# Patient Record
Sex: Female | Born: 1975 | Race: White | Hispanic: No | Smoking: Former smoker
Health system: Southern US, Community
[De-identification: ages and names within clinical notes are randomized; demographics above are authoritative.]

## PROBLEM LIST (undated history)

## (undated) ENCOUNTER — Emergency Department (HOSPITAL_COMMUNITY): Admission: EM | Discharge status: Against Medical Advice | Payer: Self-pay

## (undated) DIAGNOSIS — A599 Trichomoniasis, unspecified: Secondary | ICD-10-CM

## (undated) DIAGNOSIS — A630 Anogenital (venereal) warts: Secondary | ICD-10-CM

## (undated) DIAGNOSIS — J40 Bronchitis, not specified as acute or chronic: Secondary | ICD-10-CM

## (undated) HISTORY — DX: Trichomoniasis, unspecified: A59.9

## (undated) HISTORY — PX: FEMUR FRACTURE SURGERY: SHX633

---

## 2003-10-25 ENCOUNTER — Ambulatory Visit (HOSPITAL_COMMUNITY): Admission: RE | Admit: 2003-10-25 | Discharge: 2003-10-25 | Payer: Self-pay | Admitting: Obstetrics & Gynecology

## 2004-02-12 ENCOUNTER — Ambulatory Visit: Payer: Self-pay | Admitting: Family Medicine

## 2004-03-05 ENCOUNTER — Ambulatory Visit: Payer: Self-pay | Admitting: Family Medicine

## 2004-05-06 ENCOUNTER — Ambulatory Visit: Payer: Self-pay | Admitting: Family Medicine

## 2004-05-06 ENCOUNTER — Ambulatory Visit (HOSPITAL_COMMUNITY): Admission: RE | Admit: 2004-05-06 | Discharge: 2004-05-06 | Payer: Self-pay | Admitting: Family Medicine

## 2007-11-26 ENCOUNTER — Emergency Department (HOSPITAL_COMMUNITY): Admission: EM | Admit: 2007-11-26 | Discharge: 2007-11-27 | Payer: Self-pay | Admitting: Emergency Medicine

## 2008-01-25 ENCOUNTER — Emergency Department (HOSPITAL_COMMUNITY): Admission: EM | Admit: 2008-01-25 | Discharge: 2008-01-25 | Payer: Self-pay | Admitting: Emergency Medicine

## 2008-05-08 ENCOUNTER — Emergency Department (HOSPITAL_COMMUNITY): Admission: EM | Admit: 2008-05-08 | Discharge: 2008-05-08 | Payer: Self-pay | Admitting: Emergency Medicine

## 2008-05-17 ENCOUNTER — Emergency Department (HOSPITAL_COMMUNITY): Admission: EM | Admit: 2008-05-17 | Discharge: 2008-05-17 | Payer: Self-pay | Admitting: Family Medicine

## 2008-06-12 ENCOUNTER — Emergency Department (HOSPITAL_COMMUNITY): Admission: EM | Admit: 2008-06-12 | Discharge: 2008-06-12 | Payer: Self-pay | Admitting: Emergency Medicine

## 2008-11-05 ENCOUNTER — Inpatient Hospital Stay (HOSPITAL_COMMUNITY): Admission: AD | Admit: 2008-11-05 | Discharge: 2008-11-07 | Payer: Self-pay | Admitting: Obstetrics & Gynecology

## 2009-08-03 ENCOUNTER — Ambulatory Visit (HOSPITAL_COMMUNITY): Admission: RE | Admit: 2009-08-03 | Discharge: 2009-08-03 | Payer: Self-pay | Admitting: Obstetrics & Gynecology

## 2009-10-23 ENCOUNTER — Ambulatory Visit (HOSPITAL_COMMUNITY): Admission: RE | Admit: 2009-10-23 | Discharge: 2009-10-23 | Payer: Self-pay | Admitting: Obstetrics & Gynecology

## 2009-12-20 ENCOUNTER — Encounter: Payer: Self-pay | Admitting: *Deleted

## 2009-12-20 ENCOUNTER — Emergency Department (HOSPITAL_COMMUNITY): Admission: EM | Admit: 2009-12-20 | Discharge: 2009-12-20 | Payer: Self-pay | Admitting: Family Medicine

## 2010-01-08 ENCOUNTER — Ambulatory Visit (HOSPITAL_COMMUNITY): Admission: RE | Admit: 2010-01-08 | Discharge: 2010-01-08 | Payer: Self-pay | Admitting: Obstetrics

## 2010-02-14 ENCOUNTER — Emergency Department (HOSPITAL_COMMUNITY): Admission: EM | Admit: 2010-02-14 | Discharge: 2010-02-14 | Payer: Self-pay | Admitting: Emergency Medicine

## 2010-02-28 ENCOUNTER — Inpatient Hospital Stay (HOSPITAL_COMMUNITY): Admission: AD | Admit: 2010-02-28 | Discharge: 2010-03-02 | Payer: Self-pay | Admitting: Obstetrics

## 2010-05-08 NOTE — Miscellaneous (Signed)
Summary: gave 1 time only for UC to see her.   Clinical Lists Changes our name is on her M card. told them ok to see her but instruct pt to get our name off the card.Golden Circle RN  December 20, 2009 9:15 AM

## 2010-06-07 ENCOUNTER — Emergency Department (HOSPITAL_COMMUNITY)
Admission: EM | Admit: 2010-06-07 | Discharge: 2010-06-07 | Disposition: A | Discharge status: Home / Self Care | Payer: Medicaid Other | Attending: Emergency Medicine | Admitting: Emergency Medicine

## 2010-06-07 DIAGNOSIS — K625 Hemorrhage of anus and rectum: Secondary | ICD-10-CM | POA: Insufficient documentation

## 2010-06-07 DIAGNOSIS — A630 Anogenital (venereal) warts: Secondary | ICD-10-CM | POA: Insufficient documentation

## 2010-06-07 LAB — DIFFERENTIAL
Eosinophils Absolute: 0.3 10*3/uL (ref 0.0–0.7)
Lymphs Abs: 3 10*3/uL (ref 0.7–4.0)
Monocytes Absolute: 0.8 10*3/uL (ref 0.1–1.0)
Monocytes Relative: 7 % (ref 3–12)
Neutrophils Relative %: 63 % (ref 43–77)

## 2010-06-07 LAB — CBC
MCH: 29.1 pg (ref 26.0–34.0)
MCHC: 33.3 g/dL (ref 30.0–36.0)
MCV: 87.4 fL (ref 78.0–100.0)
RBC: 4.78 MIL/uL (ref 3.87–5.11)
RDW: 14.2 % (ref 11.5–15.5)

## 2010-06-18 LAB — CBC
HCT: 33.9 % — ABNORMAL LOW (ref 36.0–46.0)
Hemoglobin: 11.3 g/dL — ABNORMAL LOW (ref 12.0–15.0)
Hemoglobin: 13 g/dL (ref 12.0–15.0)
MCH: 30.7 pg (ref 26.0–34.0)
MCH: 30.7 pg (ref 26.0–34.0)
MCHC: 33.3 g/dL (ref 30.0–36.0)
MCV: 92.1 fL (ref 78.0–100.0)
Platelets: 184 10*3/uL (ref 150–400)
RBC: 4.25 MIL/uL (ref 3.87–5.11)
RDW: 14.4 % (ref 11.5–15.5)
WBC: 13 10*3/uL — ABNORMAL HIGH (ref 4.0–10.5)

## 2010-06-18 LAB — RH IMMUNE GLOB WKUP(>/=20WKS)(NOT WOMEN'S HOSP): Fetal Screen: NEGATIVE

## 2010-06-18 LAB — RPR: RPR Ser Ql: NONREACTIVE

## 2010-06-20 LAB — RH IMMUNE GLOBULIN WORKUP (NOT WOMEN'S HOSP)

## 2010-07-13 LAB — RH IMMUNE GLOB WKUP(>/=20WKS)(NOT WOMEN'S HOSP)

## 2010-07-13 LAB — CBC
HCT: 31.4 % — ABNORMAL LOW (ref 36.0–46.0)
HCT: 38.8 % (ref 36.0–46.0)
Hemoglobin: 11 g/dL — ABNORMAL LOW (ref 12.0–15.0)
Hemoglobin: 13.1 g/dL (ref 12.0–15.0)
MCHC: 35.1 g/dL (ref 30.0–36.0)
MCV: 90.7 fL (ref 78.0–100.0)
RBC: 3.46 MIL/uL — ABNORMAL LOW (ref 3.87–5.11)
RBC: 4.21 MIL/uL (ref 3.87–5.11)
RDW: 14.4 % (ref 11.5–15.5)
WBC: 13.9 10*3/uL — ABNORMAL HIGH (ref 4.0–10.5)
WBC: 15.5 10*3/uL — ABNORMAL HIGH (ref 4.0–10.5)

## 2010-07-23 LAB — POCT URINALYSIS DIP (DEVICE)
Glucose, UA: NEGATIVE mg/dL
Hgb urine dipstick: NEGATIVE
Nitrite: NEGATIVE
Urobilinogen, UA: 0.2 mg/dL (ref 0.0–1.0)
pH: 5.5 (ref 5.0–8.0)

## 2011-01-06 ENCOUNTER — Emergency Department (HOSPITAL_COMMUNITY): Payer: Medicaid Other

## 2011-01-06 ENCOUNTER — Emergency Department (HOSPITAL_COMMUNITY)
Admission: EM | Admit: 2011-01-06 | Discharge: 2011-01-06 | Disposition: A | Discharge status: Home / Self Care | Payer: Medicaid Other | Attending: Emergency Medicine | Admitting: Emergency Medicine

## 2011-01-06 DIAGNOSIS — R630 Anorexia: Secondary | ICD-10-CM | POA: Insufficient documentation

## 2011-01-06 DIAGNOSIS — D72829 Elevated white blood cell count, unspecified: Secondary | ICD-10-CM | POA: Insufficient documentation

## 2011-01-06 DIAGNOSIS — R10816 Epigastric abdominal tenderness: Secondary | ICD-10-CM | POA: Insufficient documentation

## 2011-01-06 DIAGNOSIS — M549 Dorsalgia, unspecified: Secondary | ICD-10-CM | POA: Insufficient documentation

## 2011-01-06 DIAGNOSIS — R1013 Epigastric pain: Secondary | ICD-10-CM | POA: Insufficient documentation

## 2011-01-06 DIAGNOSIS — E669 Obesity, unspecified: Secondary | ICD-10-CM | POA: Insufficient documentation

## 2011-01-06 LAB — COMPREHENSIVE METABOLIC PANEL
ALT: 14 U/L (ref 0–35)
Alkaline Phosphatase: 93 U/L (ref 39–117)
Chloride: 102 mEq/L (ref 96–112)
Creatinine, Ser: 0.85 mg/dL (ref 0.50–1.10)
GFR calc Af Amer: >90 mL/min (ref >90)
GFR calc non Af Amer: 88 mL/min — ABNORMAL LOW (ref >90)
Total Protein: 7.1 g/dL (ref 6.0–8.3)

## 2011-01-06 LAB — DIFFERENTIAL
Eosinophils Absolute: 0.3 10*3/uL (ref 0.0–0.7)
Eosinophils Relative: 3 % (ref 0–5)
Lymphs Abs: 2.9 10*3/uL (ref 0.7–4.0)
Monocytes Relative: 8 % (ref 3–12)

## 2011-01-06 LAB — LIPASE, BLOOD: Lipase: 33 U/L (ref 11–59)

## 2011-01-06 LAB — CBC
MCH: 30.1 pg (ref 26.0–34.0)
MCHC: 34.1 g/dL (ref 30.0–36.0)
MCV: 88.4 fL (ref 78.0–100.0)
Platelets: 248 10*3/uL (ref 150–400)
RDW: 14.3 % (ref 11.5–15.5)

## 2011-01-06 LAB — URINALYSIS, ROUTINE W REFLEX MICROSCOPIC
Glucose, UA: NEGATIVE mg/dL
Hgb urine dipstick: NEGATIVE
Ketones, ur: NEGATIVE mg/dL
Protein, ur: NEGATIVE mg/dL
Urobilinogen, UA: 0.2 mg/dL (ref 0.0–1.0)

## 2011-03-09 ENCOUNTER — Encounter: Payer: Self-pay | Admitting: *Deleted

## 2011-03-09 ENCOUNTER — Emergency Department (HOSPITAL_COMMUNITY)
Admission: EM | Admit: 2011-03-09 | Discharge: 2011-03-09 | Disposition: A | Discharge status: Home / Self Care | Payer: Self-pay | Attending: Emergency Medicine | Admitting: Emergency Medicine

## 2011-03-09 ENCOUNTER — Emergency Department (HOSPITAL_COMMUNITY): Payer: Self-pay

## 2011-03-09 DIAGNOSIS — R059 Cough, unspecified: Secondary | ICD-10-CM | POA: Insufficient documentation

## 2011-03-09 DIAGNOSIS — R0789 Other chest pain: Secondary | ICD-10-CM | POA: Insufficient documentation

## 2011-03-09 DIAGNOSIS — R05 Cough: Secondary | ICD-10-CM | POA: Insufficient documentation

## 2011-03-09 DIAGNOSIS — R6883 Chills (without fever): Secondary | ICD-10-CM | POA: Insufficient documentation

## 2011-03-09 DIAGNOSIS — IMO0001 Reserved for inherently not codable concepts without codable children: Secondary | ICD-10-CM | POA: Insufficient documentation

## 2011-03-09 DIAGNOSIS — R070 Pain in throat: Secondary | ICD-10-CM | POA: Insufficient documentation

## 2011-03-09 DIAGNOSIS — J069 Acute upper respiratory infection, unspecified: Secondary | ICD-10-CM | POA: Insufficient documentation

## 2011-03-09 HISTORY — DX: Bronchitis, not specified as acute or chronic: J40

## 2011-03-09 MED ORDER — IBUPROFEN 800 MG PO TABS: 800.0000 mg | ORAL_TABLET | Freq: Three times a day (TID) | ORAL | Status: AC

## 2011-03-09 MED ORDER — ACETAMINOPHEN-CODEINE 120-12 MG/5ML PO SUSP: 5.0000 mL | Freq: Four times a day (QID) | ORAL | Status: AC | PRN

## 2011-03-09 MED ORDER — SALINE NASAL SPRAY 0.65 % NA SOLN: 1.0000 | NASAL | Status: DC | PRN

## 2011-03-09 NOTE — ED Notes (Signed)
Pt has been coughing since Wed.  Denies hx of asthma.  Denies fevers.  Lung sounds clear.  Pt w/non-productive, insistent cough.  Denies pain, but states she has been vomiting d/t the force of coughing.

## 2011-03-09 NOTE — ED Notes (Signed)
Patient with c/o bronichitis

## 2011-03-09 NOTE — ED Provider Notes (Signed)
History     CSN: [id number] Arrival date & time: 03/09/2011 12:23 AM   First MD Initiated Contact with Patient 03/09/11 [phone]      Chief Complaint  Patient presents with  . Bronchitis    (Consider location/radiation/quality/duration/timing/severity/associated sxs/prior treatment) Patient is a 35 y.o. female presenting with cough. The history is provided by the patient.  Cough This is a new problem. The current episode started 2 days ago. The problem occurs constantly. The problem has not changed since onset.The cough is non-productive. There has been no fever. Associated symptoms include chills, sore throat and myalgias. Pertinent negatives include no headaches and no shortness of breath. She has tried decongestants for the symptoms. The treatment provided no relief. Risk factors: Has a sick contacts at home both of her children are sick as well. She is a smoker. Her past medical history is significant for bronchitis.   moderate in severity. Some mild chest discomfort with coughing no radiation of pain. No known aggravating factors and symptoms not improved with over-the-counter medications at home. She continues to smoke. She has not received a flu shot this year.  Past Medical History  Diagnosis Date  . Bronchitis     History reviewed. No pertinent past surgical history.  History reviewed. No pertinent family history.  History  Substance Use Topics  . Smoking status: Current Some Day Smoker  . Smokeless tobacco: Not on file  . Alcohol Use: No    OB History    Grav Para Term Preterm Abortions TAB SAB Ect Mult Living                  Review of Systems  Constitutional: Positive for chills. Negative for fever.  HENT: Positive for sore throat. Negative for trouble swallowing, neck pain, neck stiffness and voice change.   Eyes: Negative for pain.  Respiratory: Positive for cough. Negative for shortness of breath.   Cardiovascular: Negative for palpitations and leg swelling.    Gastrointestinal: Negative for abdominal pain.  Genitourinary: Negative for dysuria.  Musculoskeletal: Positive for myalgias. Negative for back pain.  Skin: Negative for rash.  Neurological: Negative for headaches.  All other systems reviewed and are negative.    Allergies  Review of patient's allergies indicates no known allergies.  Home Medications   Current Outpatient Rx  Name Route Sig Dispense Refill  . ACETAMINOPHEN 500 MG PO TABS Oral Take 1,000 mg by mouth every 6 (six) hours as needed. For pain       BP 127/84  Pulse 77  Temp(Src) 98.5 F (36.9 C) (Oral)  Resp 12  SpO2 99%  Physical Exam  Constitutional: She is oriented to person, place, and time. She appears well-developed and well-nourished.  HENT:  Head: Normocephalic and atraumatic.  Mouth/Throat: No oropharyngeal exudate.  Eyes: Conjunctivae and EOM are normal. Pupils are equal, round, and reactive to light.  Neck: Trachea normal. Neck supple. No thyromegaly present.  Cardiovascular: Normal rate, regular rhythm, S1 normal, S2 normal and normal pulses.     No systolic murmur is present   No diastolic murmur is present  Pulses:      Radial pulses are 2+ on the right side, and 2+ on the left side.  Pulmonary/Chest: Effort normal and breath sounds normal. She has no wheezes. She has no rhonchi. She has no rales. She exhibits no tenderness.  Abdominal: Soft. Normal appearance and bowel sounds are normal. There is no tenderness. There is no CVA tenderness and negative Murphy's sign.  Musculoskeletal:  BLE:s Calves nontender, no cords or erythema, negative Homans sign  Neurological: She is alert and oriented to person, place, and time. She has normal strength. No cranial nerve deficit or sensory deficit. GCS eye subscore is 4. GCS verbal subscore is 5. GCS motor subscore is 6.  Skin: Skin is warm and dry. No rash noted. She is not diaphoretic.  Psychiatric: Her speech is normal.       Cooperative and  appropriate    ED Course  Procedures (including critical care time)  Labs Reviewed - No data to display Dg Chest 2 View  03/09/2011  *RADIOLOGY REPORT*  Clinical Data: Cough and chest pain; burning throat.  CHEST - 2 VIEW  Comparison: None.  Findings: The lungs are well-aerated.  Peribronchial thickening is noted.  Mildly increased interstitial markings may be chronic in nature.  There is no evidence of focal opacification, pleural effusion or pneumothorax.  The heart is normal in size; the mediastinal contour is within normal limits.  No acute osseous abnormalities are seen.  IMPRESSION: Peribronchial thickening noted; mildly increased interstitial markings may be chronic in nature.  No evidence of focal consolidation.  Original Report Authenticated By: Tonia Ghent, M.D.   Pulse ox 96% room air is adequate  Diagnosis URI   MDM   Clinical URI, likely viral. Chest x-ray obtained and reviewed as above. Plan prescriptions and outpatient treatment for the same. No indication for further workup or admission at this time. Patient understands smoking is bad for her health and agrees to get a flu shot when she is feeling better        Sunnie Nielsen, MD 03/09/11 [phone]

## 2011-04-21 ENCOUNTER — Emergency Department (HOSPITAL_COMMUNITY)
Admission: EM | Admit: 2011-04-21 | Discharge: 2011-04-21 | Disposition: A | Discharge status: Home / Self Care | Payer: Self-pay | Attending: Emergency Medicine | Admitting: Emergency Medicine

## 2011-04-21 ENCOUNTER — Encounter (HOSPITAL_COMMUNITY): Payer: Self-pay | Admitting: Emergency Medicine

## 2011-04-21 DIAGNOSIS — S92919A Unspecified fracture of unspecified toe(s), initial encounter for closed fracture: Secondary | ICD-10-CM | POA: Insufficient documentation

## 2011-04-21 DIAGNOSIS — M7989 Other specified soft tissue disorders: Secondary | ICD-10-CM | POA: Insufficient documentation

## 2011-04-21 DIAGNOSIS — IMO0002 Reserved for concepts with insufficient information to code with codable children: Secondary | ICD-10-CM | POA: Insufficient documentation

## 2011-04-21 DIAGNOSIS — M79609 Pain in unspecified limb: Secondary | ICD-10-CM | POA: Insufficient documentation

## 2011-04-21 DIAGNOSIS — S92911A Unspecified fracture of right toe(s), initial encounter for closed fracture: Secondary | ICD-10-CM

## 2011-04-21 MED ORDER — NAPROXEN 500 MG PO TABS: 500.0000 mg | ORAL_TABLET | Freq: Two times a day (BID) | ORAL | Status: AC

## 2011-04-21 NOTE — ED Notes (Signed)
Pt hit R pinky toe very hard on 04/19/11.  Pt has trouble walking.  Foot very bruised

## 2011-04-21 NOTE — Progress Notes (Signed)
Orthopedic Tech Progress Note Patient Details:  Alicia Greene 02/04/1976 [id number]  Other Ortho Devices Type of Ortho Device: Buddy tape;Postop boot Ortho Device Location: applied buddy tape and post op shoe to right foot   Gaye Pollack 04/21/2011, 7:38 AM

## 2011-04-21 NOTE — ED Notes (Signed)
Done per ortho

## 2011-04-21 NOTE — ED Provider Notes (Signed)
History     CSN: [id number]  Arrival date & time 04/21/11  4098   First MD Initiated Contact with Patient 04/21/11 0703      Chief Complaint  Patient presents with  . Toe Injury    (Consider location/radiation/quality/duration/timing/severity/associated sxs/prior treatment) Patient is a 36 y.o. female presenting with toe pain. The history is provided by the patient.  Toe Pain This is a new problem. The current episode started 2 days ago. The problem occurs constantly. The problem has not changed since onset.Associated symptoms comments: None . The symptoms are aggravated by walking. The symptoms are relieved by nothing. She has tried nothing for the symptoms.  Pt stubbed her toe hard on Saturday night.  She has noticed bruising and swelling.  Pt tried to go to work this am and it hurt too much to put her boot on.  Past Medical History  Diagnosis Date  . Bronchitis     History reviewed. No pertinent past surgical history.  No family history on file.  History  Substance Use Topics  . Smoking status: Current Some Day Smoker  . Smokeless tobacco: Not on file  . Alcohol Use: No    OB History    Grav Para Term Preterm Abortions TAB SAB Ect Mult Living                  Review of Systems  All other systems reviewed and are negative.    Allergies  Review of patient's allergies indicates no known allergies.  Home Medications   Current Outpatient Rx  Name Route Sig Dispense Refill  . IBUPROFEN 800 MG PO TABS Oral Take 800 mg by mouth as needed. For menstrual cramps    . NAPROXEN 500 MG PO TABS Oral Take 1 tablet (500 mg total) by mouth 2 (two) times daily. 30 tablet 0    There were no vitals taken for this visit.  Physical Exam  Nursing note and vitals reviewed. Constitutional: She appears well-developed and well-nourished. No distress.  HENT:  Head: Normocephalic and atraumatic.  Right Ear: External ear normal.  Left Ear: External ear normal.  Eyes:  Conjunctivae are normal. Right eye exhibits no discharge. Left eye exhibits no discharge. No scleral icterus.  Neck: Neck supple. No tracheal deviation present.  Cardiovascular: Normal rate.   Pulmonary/Chest: Effort normal. No stridor. No respiratory distress.  Musculoskeletal: She exhibits no edema.       Right foot: She exhibits bony tenderness and swelling. She exhibits no deformity and no laceration.       Feet:  Neurological: She is alert. Cranial nerve deficit: no gross deficits.  Skin: Skin is warm and dry. No rash noted.  Psychiatric: She has a normal mood and affect.    ED Course  Procedures (including critical care time)  Labs Reviewed - No data to display No results found.   1. Toe fracture, right       MDM  I suspect patient has a distal phalanx fracture of her toe. Clinically she has bruising and swelling. Discussed with the patient about the x-ray and that it would not change management whether she had bruising alone or fracture. She opted for no x-ray and presumptive treatment.        Celene Kras, MD 04/21/11 [phone]

## 2011-05-26 ENCOUNTER — Encounter (HOSPITAL_COMMUNITY): Payer: Self-pay | Admitting: Emergency Medicine

## 2011-05-26 ENCOUNTER — Emergency Department (INDEPENDENT_AMBULATORY_CARE_PROVIDER_SITE_OTHER)
Admission: EM | Admit: 2011-05-26 | Discharge: 2011-05-26 | Disposition: A | Discharge status: Home Health | Payer: Self-pay | Source: Home / Self Care | Attending: Emergency Medicine | Admitting: Emergency Medicine

## 2011-05-26 DIAGNOSIS — L255 Unspecified contact dermatitis due to plants, except food: Secondary | ICD-10-CM

## 2011-05-26 MED ORDER — PREDNISONE 10 MG PO TABS: ORAL_TABLET | ORAL | Status: DC

## 2011-05-26 MED ORDER — TRIAMCINOLONE ACETONIDE 0.1 % EX CREA: TOPICAL_CREAM | Freq: Three times a day (TID) | CUTANEOUS | Status: DC

## 2011-05-26 MED ORDER — METHYLPREDNISOLONE ACETATE PF 80 MG/ML IJ SUSP
80.0000 mg | Freq: Once | INTRAMUSCULAR | Status: AC
  Administered 2011-05-26: 80 mg via INTRAMUSCULAR

## 2011-05-26 MED ORDER — METHYLPREDNISOLONE ACETATE 80 MG/ML IJ SUSP
INTRAMUSCULAR | Status: AC
  Filled 2011-05-26: qty 1

## 2011-05-26 MED ORDER — HYDROXYZINE HCL 25 MG PO TABS: 25.0000 mg | ORAL_TABLET | Freq: Four times a day (QID) | ORAL | Status: DC

## 2011-05-26 NOTE — ED Provider Notes (Signed)
Chief Complaint  Patient presents with  . Rash  . Poison Ivy    History of Present Illness:   [place] work out in her yard 4 days ago and next day he broke out in a pruritic rash on her arms, trunk, face, and neck. She denies any difficulty breathing, wheezing, shortness of breath, or swelling of the lips, tongue, or throat.  Review of Systems:  Other than noted above, the patient denies any of the following symptoms: Systemic:  No fever, chills, sweats, weight loss, or fatigue. ENT:  No nasal congestion, rhinorrhea, sore throat, swelling of lips, tongue or throat. Resp:  No cough, wheezing, or shortness of breath. Skin:  No rash, itching, nodules, or suspicious lesions.  PMFSH:  Past medical history, family history, social history, meds, and allergies were reviewed.  Physical Exam:   Vital signs:  BP 147/88  Pulse 88  Temp(Src) 99.2 F (37.3 C) (Oral)  Resp 16  SpO2 98%  LMP 05/12/2011 Gen:  Alert, oriented, in no distress. Skin:  Exam of her skin reveals streaks and patches of erythematous maculopapules and vesicles on her arms, trunk, face, and neck. Lungs: Auscultation without wheezes, rales, or rhonchi.  Assessment:   Diagnoses that have been ruled out:  None  Diagnoses that are still under consideration:  None  Final diagnoses:  Rhus dermatitis    Plan:   1.  The following meds were prescribed:   New Prescriptions   HYDROXYZINE (ATARAX/VISTARIL) 25 MG TABLET    Take 1 tablet (25 mg total) by mouth every 6 (six) hours.   PREDNISONE (DELTASONE) 10 MG TABLET    Take 4 tabs daily for 4 days, 3 tabs daily for 4 days, 2 tabs daily for 4 days, then 1 tab daily for 4 days.   TRIAMCINOLONE CREAM (KENALOG) 0.1 %    Apply topically 3 (three) times daily.   2.  The patient was instructed in symptomatic care and handouts were given. 3.  The patient was told to return if becoming worse in any way, if no better in 3 or 4 days, and given some red flag symptoms that would indicate  earlier return.     Roque Lias, MD 05/26/11 [phone]

## 2011-05-26 NOTE — ED Notes (Signed)
Pt here with sudden outbreak of hives,poison oak that started last Friday on hands then spread all over.pus filled blisters seen to right upper arm with cellulitis.pt tried Epson bath/alcohol but it worsening.sx started after yard work last week

## 2011-05-26 NOTE — Discharge Instructions (Signed)
Poison Ivy Poison ivy is a inflammation of the skin (contact dermatitis) caused by touching the allergens on the leaves of the ivy plant following previous exposure to the plant. The rash usually appears 48 hours after exposure. The rash is usually bumps (papules) or blisters (vesicles) in a linear pattern. Depending on your own sensitivity, the rash may simply cause redness and itching, or it may also progress to blisters which may break open. These must be well cared for to prevent secondary bacterial (germ) infection, followed by scarring. Keep any open areas dry, clean, dressed, and covered with an antibacterial ointment if needed. The eyes may also get puffy. The puffiness is worst in the morning and gets better as the day progresses. This dermatitis usually heals without scarring, within 2 to 3 weeks without treatment. HOME CARE INSTRUCTIONS  Thoroughly wash with soap and water as soon as you have been exposed to poison ivy. You have about one half hour to remove the plant resin before it will cause the rash. This washing will destroy the oil or antigen on the skin that is causing, or will cause, the rash. Be sure to wash under your fingernails as any plant resin there will continue to spread the rash. Do not rub skin vigorously when washing affected area. Poison ivy cannot spread if no oil from the plant remains on your body. A rash that has progressed to weeping sores will not spread the rash unless you have not washed thoroughly. It is also important to wash any clothes you have been wearing as these may carry active allergens. The rash will return if you wear the unwashed clothing, even several days later. Avoidance of the plant in the future is the best measure. Poison ivy plant can be recognized by the number of leaves. Generally, poison ivy has three leaves with flowering branches on a single stem. Diphenhydramine may be purchased over the counter and used as needed for itching. Do not drive with  this medication if it makes you drowsy.Ask your caregiver about medication for children. SEEK MEDICAL CARE IF:  Open sores develop.   Redness spreads beyond area of rash.   You notice purulent (pus-like) discharge.   You have increased pain.   Other signs of infection develop (such as fever).  Document Released: 03/21/2000 Document Revised: 12/04/2010 Document Reviewed: 02/07/2009 [employer] Patient Information 2012 [employer]. 

## 2011-05-28 ENCOUNTER — Emergency Department (INDEPENDENT_AMBULATORY_CARE_PROVIDER_SITE_OTHER)
Admission: EM | Admit: 2011-05-28 | Discharge: 2011-05-28 | Disposition: A | Discharge status: Home Health | Payer: Self-pay | Source: Home / Self Care

## 2011-05-28 ENCOUNTER — Encounter (HOSPITAL_COMMUNITY): Payer: Self-pay

## 2011-05-28 DIAGNOSIS — L237 Allergic contact dermatitis due to plants, except food: Secondary | ICD-10-CM

## 2011-05-28 DIAGNOSIS — L255 Unspecified contact dermatitis due to plants, except food: Secondary | ICD-10-CM

## 2011-05-28 MED ORDER — PREDNISONE 10 MG PO TABS: ORAL_TABLET | ORAL | Status: DC

## 2011-05-28 MED ORDER — TRIAMCINOLONE ACETONIDE 0.1 % EX CREA: TOPICAL_CREAM | Freq: Two times a day (BID) | CUTANEOUS | Status: AC

## 2011-05-28 NOTE — ED Provider Notes (Signed)
Medical screening examination/treatment/procedure(s) were performed by non-physician practitioner and as supervising physician I was immediately available for consultation/collaboration.  Corrie Mckusick, MD 05/28/11 2214

## 2011-05-28 NOTE — ED Provider Notes (Signed)
History     CSN: [id number]  Arrival date & time 05/28/11  1253   None     Chief Complaint  Patient presents with  . Poison Ivy    (Consider location/radiation/quality/duration/timing/severity/associated sxs/prior treatment) HPI Comments: Patient presents today with complaints of worsening poison ivy symptoms. She was seen 2 days ago for the same. She received a steroid injection and has also begun taking oral prednisone. She was unable to afford the steroid cream and Atarax prescriptions. She has been instead applying alcohol and hot water to the lesions. She states that she was seen yesterday the rash continues to spread and is now on her face.   Past Medical History  Diagnosis Date  . Bronchitis     History reviewed. No pertinent past surgical history.  History reviewed. No pertinent family history.  History  Substance Use Topics  . Smoking status: Current Some Day Smoker  . Smokeless tobacco: Not on file  . Alcohol Use: No    OB History    Grav Para Term Preterm Abortions TAB SAB Ect Mult Living                  Review of Systems  All other systems reviewed and are negative.    Allergies  Review of patient's allergies indicates no known allergies.  Home Medications   Current Outpatient Rx  Name Route Sig Dispense Refill  . IBUPROFEN 800 MG PO TABS Oral Take 800 mg by mouth as needed. For menstrual cramps    . NAPROXEN 500 MG PO TABS Oral Take 1 tablet (500 mg total) by mouth 2 (two) times daily. 30 tablet 0  . PREDNISONE 10 MG PO TABS  Take as directed on discharge instructions. 10 tablet 0  . TRIAMCINOLONE ACETONIDE 0.1 % EX CREA Topical Apply topically 2 (two) times daily. 15 g 0    BP 150/87  Pulse 102  Temp(Src) 99.2 F (37.3 C) (Oral)  Resp 16  SpO2 98%  LMP 05/12/2011  Physical Exam  Nursing note and vitals reviewed. Constitutional: She appears well-developed and well-nourished. No distress.  HENT:  Head: Normocephalic and atraumatic.    Right Ear: Tympanic membrane, external ear and ear canal normal.  Left Ear: Tympanic membrane, external ear and ear canal normal.  Nose: Nose normal.  Mouth/Throat: Uvula is midline, oropharynx is clear and moist and mucous membranes are normal. No oropharyngeal exudate, posterior oropharyngeal edema or posterior oropharyngeal erythema.  Neck: Neck supple.  Cardiovascular: Normal rate, regular rhythm and normal heart sounds.   Pulmonary/Chest: Effort normal and breath sounds normal. No respiratory distress.  Lymphadenopathy:    She has no cervical adenopathy.  Neurological: She is alert.  Skin: Skin is warm and dry. Rash noted.       Erythematous papular rash noted, grouped and in linear distribution. Some lesions are scabbed and a few with vesicles seen. Mild erythema Lt upper eyelid and forehead. Lesion primarily on bilat UE, but also on thorax.   Psychiatric: She has a normal mood and affect.    ED Course  Procedures (including critical care time)  Labs Reviewed - No data to display No results found.   1. Contact dermatitis due to poison ivy       MDM  Increased oral prednisone dose to 60 mg x 2 days with new taper instructions. Discussed with pt benefit of using rx steroid cream. To d/c use of hot water and alcohol.         Karstyn Birkey  Vidal Schwalbe, PA 05/28/11 1656

## 2011-05-28 NOTE — ED Notes (Signed)
Reports worsening poison ivy symptoms, welt like eruptions on both arms, facial swelling noted

## 2011-05-28 NOTE — Discharge Instructions (Signed)
You may take Benadryl 50 mg every 6 hrs or Loratadine 10 mg once daily for itching. Follow the directions on the bottle. Stop using hot water and alcohol on your skin. You may use Calamine or Caladryl lotion to help with itching also. New prednisone dosing schedule:      6 tablets daily for 2 days, then 4 tablets daily x 3 days, then 3 tablets daily x 3 days, then 2 tablets daily x 3 days, then 1 tablet daily x 3 days. I have reprinted the steroid cream prescription for you also. You may be able to find this cheaper at another pharmacy.

## 2012-08-24 ENCOUNTER — Ambulatory Visit: Payer: Self-pay | Admitting: Obstetrics

## 2012-12-03 ENCOUNTER — Ambulatory Visit (INDEPENDENT_AMBULATORY_CARE_PROVIDER_SITE_OTHER): Payer: Medicaid Other | Admitting: Advanced Practice Midwife

## 2012-12-03 ENCOUNTER — Encounter: Payer: Self-pay | Admitting: Advanced Practice Midwife

## 2012-12-03 VITALS — BP 141/86 | Temp 98.4°F | Ht 66.0 in | Wt 201.8 lb

## 2012-12-03 DIAGNOSIS — Z87891 Personal history of nicotine dependence: Secondary | ICD-10-CM | POA: Insufficient documentation

## 2012-12-03 DIAGNOSIS — Z3481 Encounter for supervision of other normal pregnancy, first trimester: Secondary | ICD-10-CM

## 2012-12-03 DIAGNOSIS — O360139 Maternal care for anti-D [Rh] antibodies, third trimester, other fetus: Secondary | ICD-10-CM

## 2012-12-03 DIAGNOSIS — F17200 Nicotine dependence, unspecified, uncomplicated: Secondary | ICD-10-CM

## 2012-12-03 DIAGNOSIS — I10 Essential (primary) hypertension: Secondary | ICD-10-CM

## 2012-12-03 DIAGNOSIS — O09529 Supervision of elderly multigravida, unspecified trimester: Secondary | ICD-10-CM

## 2012-12-03 DIAGNOSIS — IMO0002 Reserved for concepts with insufficient information to code with codable children: Secondary | ICD-10-CM

## 2012-12-03 DIAGNOSIS — Z3201 Encounter for pregnancy test, result positive: Secondary | ICD-10-CM

## 2012-12-03 DIAGNOSIS — Z3480 Encounter for supervision of other normal pregnancy, unspecified trimester: Secondary | ICD-10-CM | POA: Insufficient documentation

## 2012-12-03 DIAGNOSIS — O360990 Maternal care for other rhesus isoimmunization, unspecified trimester, not applicable or unspecified: Secondary | ICD-10-CM

## 2012-12-03 DIAGNOSIS — R6889 Other general symptoms and signs: Secondary | ICD-10-CM

## 2012-12-03 LAB — POCT URINALYSIS DIPSTICK
Bilirubin, UA: NEGATIVE
Blood, UA: NEGATIVE
Glucose, UA: NEGATIVE
Spec Grav, UA: 1.02
Urobilinogen, UA: NEGATIVE
pH, UA: 5

## 2012-12-03 LAB — OB RESULTS CONSOLE GC/CHLAMYDIA
Chlamydia: NEGATIVE
GC PROBE AMP, GENITAL: NEGATIVE

## 2012-12-03 LAB — OB RESULTS CONSOLE GBS: STREP GROUP B AG: POSITIVE

## 2012-12-03 NOTE — Progress Notes (Signed)
Pulse- 67 . Subjective:    Alicia Greene is being seen today for her first obstetrical visit.  This is not a planned pregnancy. She is at [redacted]w[redacted]d gestation. Her obstetrical history is significant for advanced maternal age. Relationship with FOB: significant other, living together. Patient does intend to breast feed. Pregnancy history fully reviewed.  Patient smoking less than 1 PPD, desires to quit, Does not want to take antidepressants despite knowing they are helpful to increase quit success.   Patient believes her BP is elevated today due to life stressors, reports being very busy.     Menstrual History: OB History   Grav Para Term Preterm Abortions TAB SAB Ect Mult Living   5 3 3  1  1   3       Menarche age: 65 Patient's last menstrual period was 10/14/2012.    The following portions of the patient's history were reviewed and updated as appropriate: allergies, current medications, past family history, past medical history, past social history, past surgical history and problem list.  Review of Systems Pertinent items are noted in HPI.    Objective:    BP 141/86  Temp(Src) 98.4 F (36.9 C)  Ht 5\' 6"  (1.676 m)  Wt 201 lb 12.8 oz (91.536 kg)  BMI 32.59 kg/m2  LMP 10/14/2012  General Appearance:    Alert, cooperative, no distress, appears stated age  Head:    Normocephalic, without obvious abnormality, atraumatic  Eyes:    PERRL, conjunctiva/corneas clear, EOM's intact, fundi    benign, both eyes  Ears:    Normal TM's and external ear canals, both ears  Nose:   Nares normal, septum midline, mucosa normal, no drainage    or sinus tenderness  Throat:   Lips, mucosa, and tongue normal; teeth and gums normal  Neck:   Supple, symmetrical, trachea midline, no adenopathy;    thyroid:  no enlargement/tenderness/nodules; no carotid   bruit or JVD  Back:     Symmetric, no curvature, ROM normal, no CVA tenderness  Lungs:     Clear to auscultation bilaterally, respirations  unlabored  Chest Wall:    No tenderness or deformity   Heart:    Regular rate and rhythm, S1 and S2 normal, no murmur, rub   or gallop  Breast Exam:    No tenderness, masses, or nipple abnormality  Abdomen:     Soft, non-tender, bowel sounds active all four quadrants,    no masses, no organomegaly  Genitalia:    Normal female without lesion, discharge or tenderness  Rectal:    Normal tone, normal prostate, no masses or tenderness;   guaiac negative stool  Extremities:   Extremities normal, atraumatic, no cyanosis or edema  Pulses:   2+ and symmetric all extremities  Skin:   Skin color, texture, turgor normal, no rashes or lesions  Lymph nodes:   Cervical, supraclavicular, and axillary nodes normal  Neurologic:   CNII-XII intact, normal strength, sensation and reflexes    throughout      Assessment:    Pregnancy at [redacted]w[redacted]d weeks   Patient Active Problem List   Diagnosis Date Noted  . Smoker 12/03/2012  . Supervision of other normal pregnancy 12/03/2012  . AMA (advanced maternal age) multigravida 35+ 12/03/2012  . High blood pressure 12/03/2012      Plan:    Initial labs drawn. Prenatal vitamins.  Counseling provided regarding continued use of seat belts, cessation of alcohol consumption, smoking or use of illicit drugs; infection precautions i.e.,  influenza/TDAP immunizations, toxoplasmosis,CMV, parvovirus, listeria and varicella; workplace safety, exercise during pregnancy; routine dental care, safe medications, sexual activity, hot tubs, saunas, pools, travel, caffeine use, fish and methlymercury, potential toxins, hair treatments, varicose veins Weight gain recommendations reviewed: underweight/BMI< 18.5--> gain 28 - 40 lbs; normal weight/BMI 18.5 - 24.9--> gain 25 - 35 lbs; overweight/BMI 25 - 29.9--> gain 15 - 25 lbs; obese/BMI >30->gain  11 - 20 lbs Problem list reviewed and updated. AFP3 discussed: undecided. Role of ultrasound in pregnancy discussed; fetal survey:  requested. Amniocentesis discussed: undecided. Materni21 NV, high risk [place].  Consider referral to MFM.  Educated patient on risk of smoking to herself and the fetus including LBW, placental concerns, DVT, HTN, CA. Encouraged patient to stop smoking. Patient declined wellbutrin at todays visit, continue discussion and consider Nicotine RT NV. Give quit line number and info. Monitor BP and consider treatment.  Follow up in 4 weeks.  Cai Flott CNM 80% of 50 min visit spent on counseling and coordination of care.

## 2012-12-04 LAB — OBSTETRIC PANEL
Antibody Screen: NEGATIVE
Basophils Absolute: 0.1 10*3/uL (ref 0.0–0.1)
Eosinophils Relative: 1 % (ref 0–5)
HCT: 43.8 % (ref 36.0–46.0)
Hemoglobin: 15.1 g/dL — ABNORMAL HIGH (ref 12.0–15.0)
Lymphocytes Relative: 27 % (ref 12–46)
Lymphs Abs: 3.6 10*3/uL (ref 0.7–4.0)
MCV: 85.7 fL (ref 78.0–100.0)
Monocytes Absolute: 1.1 10*3/uL — ABNORMAL HIGH (ref 0.1–1.0)
Monocytes Relative: 8 % (ref 3–12)
Neutro Abs: 8.6 10*3/uL — ABNORMAL HIGH (ref 1.7–7.7)
RBC: 5.11 MIL/uL (ref 3.87–5.11)
Rubella: 1.74 Index — ABNORMAL HIGH (ref <0.90)
WBC: 13.5 10*3/uL — ABNORMAL HIGH (ref 4.0–10.5)

## 2012-12-04 LAB — GC/CHLAMYDIA PROBE AMP: CT Probe RNA: NEGATIVE

## 2012-12-04 LAB — VARICELLA ZOSTER ANTIBODY, IGG: Varicella IgG: 311 Index — ABNORMAL HIGH (ref <135.00)

## 2012-12-05 LAB — CULTURE, OB URINE: Colony Count: 15000

## 2012-12-07 ENCOUNTER — Encounter: Payer: Self-pay | Admitting: Advanced Practice Midwife

## 2012-12-07 LAB — PAP IG W/ RFLX HPV ASCU

## 2012-12-08 LAB — HEMOGLOBINOPATHY EVALUATION: Hgb F Quant: 0 % (ref 0.0–2.0)

## 2012-12-14 DIAGNOSIS — Z6791 Unspecified blood type, Rh negative: Secondary | ICD-10-CM | POA: Insufficient documentation

## 2012-12-14 DIAGNOSIS — IMO0002 Reserved for concepts with insufficient information to code with codable children: Secondary | ICD-10-CM | POA: Insufficient documentation

## 2012-12-31 ENCOUNTER — Encounter (HOSPITAL_COMMUNITY): Payer: Self-pay | Admitting: Advanced Practice Midwife

## 2012-12-31 ENCOUNTER — Ambulatory Visit (INDEPENDENT_AMBULATORY_CARE_PROVIDER_SITE_OTHER): Payer: Medicaid Other | Admitting: Advanced Practice Midwife

## 2012-12-31 ENCOUNTER — Encounter: Payer: Self-pay | Admitting: Advanced Practice Midwife

## 2012-12-31 VITALS — BP 132/86 | Temp 98.1°F | Wt 199.0 lb

## 2012-12-31 DIAGNOSIS — Z3481 Encounter for supervision of other normal pregnancy, first trimester: Secondary | ICD-10-CM

## 2012-12-31 DIAGNOSIS — N390 Urinary tract infection, site not specified: Secondary | ICD-10-CM

## 2012-12-31 DIAGNOSIS — O09529 Supervision of elderly multigravida, unspecified trimester: Secondary | ICD-10-CM

## 2012-12-31 DIAGNOSIS — R6889 Other general symptoms and signs: Secondary | ICD-10-CM

## 2012-12-31 DIAGNOSIS — Z3480 Encounter for supervision of other normal pregnancy, unspecified trimester: Secondary | ICD-10-CM

## 2012-12-31 DIAGNOSIS — B951 Streptococcus, group B, as the cause of diseases classified elsewhere: Secondary | ICD-10-CM

## 2012-12-31 DIAGNOSIS — IMO0002 Reserved for concepts with insufficient information to code with codable children: Secondary | ICD-10-CM

## 2012-12-31 DIAGNOSIS — O2390 Unspecified genitourinary tract infection in pregnancy, unspecified trimester: Secondary | ICD-10-CM

## 2012-12-31 DIAGNOSIS — F17200 Nicotine dependence, unspecified, uncomplicated: Secondary | ICD-10-CM

## 2012-12-31 LAB — POCT URINALYSIS DIPSTICK
Bilirubin, UA: NEGATIVE
Ketones, UA: NEGATIVE
Leukocytes, UA: NEGATIVE

## 2012-12-31 MED ORDER — AMOXICILLIN 500 MG PO CAPS: 500.0000 mg | ORAL_CAPSULE | Freq: Two times a day (BID) | ORAL | Status: DC

## 2012-12-31 NOTE — Progress Notes (Signed)
Pulse 71, patient states she has no concerns, discuss smoking and blood pressure, recheck bp 107/74 87

## 2012-12-31 NOTE — Progress Notes (Signed)
Subjective: Alicia Greene is a 37 y.o. at 11 weeks by LMP  Patient denies vaginal leaking of fluid or bleeding, denies contractions.  Reports negative fetal movment.  Denies concerns today.  Objective: Filed Vitals:   12/31/12 1057  BP: 132/86  Temp: 98.1 F (36.7 C)   160 FHR SP Fundal Height Fetal Position unknown  Assessment: Patient Active Problem List   Diagnosis Date Noted  . GBS (group B streptococcus) UTI complicating pregnancy 12/31/2012  . Rh negative state in antepartum period 12/14/2012  . 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 Smoker 12/03/2012  . Supervision of other normal pregnancy 12/03/2012  . AMA (advanced maternal age) multigravida 35+ 12/03/2012  . High blood pressure 12/03/2012    Plan: Patient to return to clinic in 4 weeks Reviewed warning signs in pregnancy. Patient to call with concerns PRN. Reviewed triage location. High Risk Consult pending for AMA and Smoking Patient given Quit line information.  Amoxicillin PO for GBS Plan Rhogam @ 28 weeks ASCUS +HPV. Repap 6 weeks PP.  Rajat Staver Wilson Singer CNM

## 2013-01-05 ENCOUNTER — Ambulatory Visit (HOSPITAL_COMMUNITY): Payer: Medicaid Other

## 2013-01-07 ENCOUNTER — Encounter (HOSPITAL_COMMUNITY): Payer: Medicaid Other

## 2013-01-21 ENCOUNTER — Other Ambulatory Visit: Payer: Self-pay | Admitting: Advanced Practice Midwife

## 2013-01-21 DIAGNOSIS — O0990 Supervision of high risk pregnancy, unspecified, unspecified trimester: Secondary | ICD-10-CM

## 2013-01-24 ENCOUNTER — Encounter: Payer: Self-pay | Admitting: Advanced Practice Midwife

## 2013-01-24 ENCOUNTER — Other Ambulatory Visit: Payer: Self-pay | Admitting: Advanced Practice Midwife

## 2013-01-24 DIAGNOSIS — O09521 Supervision of elderly multigravida, first trimester: Secondary | ICD-10-CM

## 2013-01-28 ENCOUNTER — Encounter: Payer: Medicaid Other | Admitting: Advanced Practice Midwife

## 2013-02-01 ENCOUNTER — Ambulatory Visit (INDEPENDENT_AMBULATORY_CARE_PROVIDER_SITE_OTHER): Payer: Medicaid Other | Admitting: Advanced Practice Midwife

## 2013-02-01 VITALS — BP 135/86 | Temp 98.7°F | Wt 200.0 lb

## 2013-02-01 DIAGNOSIS — Z3480 Encounter for supervision of other normal pregnancy, unspecified trimester: Secondary | ICD-10-CM

## 2013-02-01 LAB — POCT URINALYSIS DIPSTICK
Bilirubin, UA: NEGATIVE
Glucose, UA: NEGATIVE
Nitrite, UA: NEGATIVE

## 2013-02-01 NOTE — Progress Notes (Signed)
P 79 Patient reports she is doing well. 

## 2013-02-01 NOTE — Progress Notes (Signed)
Patient doing well. Nausea improved. No concerns. Has high risk [place] and consult scheduled this week, after previous cancellations.   Arbadella Kimbler Wilson Singer CNM

## 2013-02-04 ENCOUNTER — Ambulatory Visit (HOSPITAL_COMMUNITY)
Admission: RE | Admit: 2013-02-04 | Discharge: 2013-02-04 | Disposition: A | Discharge status: Home / Self Care | Payer: Medicaid Other | Source: Ambulatory Visit | Attending: Advanced Practice Midwife | Admitting: Advanced Practice Midwife

## 2013-02-04 ENCOUNTER — Encounter (HOSPITAL_COMMUNITY): Payer: Self-pay

## 2013-02-04 ENCOUNTER — Encounter: Payer: Self-pay | Admitting: Advanced Practice Midwife

## 2013-02-04 VITALS — BP 122/75 | HR 87 | Wt 200.0 lb

## 2013-02-04 DIAGNOSIS — Z3689 Encounter for other specified antenatal screening: Secondary | ICD-10-CM | POA: Insufficient documentation

## 2013-02-04 DIAGNOSIS — IMO0002 Reserved for concepts with insufficient information to code with codable children: Secondary | ICD-10-CM

## 2013-02-04 DIAGNOSIS — F17200 Nicotine dependence, unspecified, uncomplicated: Secondary | ICD-10-CM

## 2013-02-04 DIAGNOSIS — O99330 Smoking (tobacco) complicating pregnancy, unspecified trimester: Secondary | ICD-10-CM | POA: Insufficient documentation

## 2013-02-04 DIAGNOSIS — O09529 Supervision of elderly multigravida, unspecified trimester: Secondary | ICD-10-CM | POA: Insufficient documentation

## 2013-02-04 DIAGNOSIS — O09521 Supervision of elderly multigravida, first trimester: Secondary | ICD-10-CM

## 2013-02-04 DIAGNOSIS — I10 Essential (primary) hypertension: Secondary | ICD-10-CM

## 2013-02-04 LAB — US OB DETAIL + 14 WK

## 2013-02-07 ENCOUNTER — Other Ambulatory Visit: Payer: Self-pay | Admitting: Family Medicine

## 2013-02-07 DIAGNOSIS — O169 Unspecified maternal hypertension, unspecified trimester: Secondary | ICD-10-CM

## 2013-02-07 DIAGNOSIS — O09529 Supervision of elderly multigravida, unspecified trimester: Secondary | ICD-10-CM

## 2013-02-10 ENCOUNTER — Encounter: Payer: Self-pay | Admitting: Obstetrics & Gynecology

## 2013-02-10 ENCOUNTER — Other Ambulatory Visit: Payer: Self-pay

## 2013-02-16 ENCOUNTER — Other Ambulatory Visit: Payer: Self-pay | Admitting: Obstetrics

## 2013-02-16 DIAGNOSIS — K029 Dental caries, unspecified: Secondary | ICD-10-CM

## 2013-02-16 MED ORDER — CLINDAMYCIN HCL 300 MG PO CAPS: 300.0000 mg | ORAL_CAPSULE | Freq: Three times a day (TID) | ORAL | Status: DC

## 2013-02-16 MED ORDER — OXYCODONE HCL 10 MG PO TABS: 10.0000 mg | ORAL_TABLET | Freq: Four times a day (QID) | ORAL | Status: DC | PRN

## 2013-02-25 ENCOUNTER — Ambulatory Visit (INDEPENDENT_AMBULATORY_CARE_PROVIDER_SITE_OTHER): Payer: Medicaid Other | Admitting: Advanced Practice Midwife

## 2013-02-25 ENCOUNTER — Encounter: Payer: Self-pay | Admitting: Advanced Practice Midwife

## 2013-02-25 VITALS — BP 120/75 | Temp 98.2°F | Wt 202.0 lb

## 2013-02-25 DIAGNOSIS — Z3482 Encounter for supervision of other normal pregnancy, second trimester: Secondary | ICD-10-CM

## 2013-02-25 DIAGNOSIS — Z3480 Encounter for supervision of other normal pregnancy, unspecified trimester: Secondary | ICD-10-CM

## 2013-02-25 DIAGNOSIS — A630 Anogenital (venereal) warts: Secondary | ICD-10-CM

## 2013-02-25 DIAGNOSIS — O0990 Supervision of high risk pregnancy, unspecified, unspecified trimester: Secondary | ICD-10-CM

## 2013-02-25 DIAGNOSIS — B977 Papillomavirus as the cause of diseases classified elsewhere: Secondary | ICD-10-CM

## 2013-02-25 DIAGNOSIS — O0992 Supervision of high risk pregnancy, unspecified, second trimester: Secondary | ICD-10-CM

## 2013-02-25 LAB — POCT URINALYSIS DIPSTICK
Bilirubin, UA: NEGATIVE
Blood, UA: NEGATIVE
Ketones, UA: NEGATIVE
Leukocytes, UA: NEGATIVE
pH, UA: 5

## 2013-02-25 NOTE — Progress Notes (Signed)
Pulse 79 Pt doing well.

## 2013-02-25 NOTE — Progress Notes (Signed)
Subjective: Alicia Greene is a 37 y.o. at 19 weeks by LMP, 16  Patient denies vaginal leaking of fluid or bleeding, denies contractions.  Reports positive fetal movment.  Denies seeing dentist for treatment. Having tooth pain, needs root canal. Denies abscess or infection.   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   Objective: Filed Vitals:   02/25/13 1355  BP: 120/75  Temp: 98.2 F (36.8 C)   150 FHR Just below U Fundal Height Fetal Position NA  Assessment: Patient Active Problem List   Diagnosis Date Noted  . GBS (group B streptococcus) UTI complicating pregnancy 12/31/2012  . Rh negative state in antepartum period 12/14/2012  . 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 Smoker 12/03/2012  . Supervision of other normal pregnancy 12/03/2012  . AMA (advanced maternal age) multigravida 35+ 12/03/2012  . High blood pressure 12/03/2012    Plan: Patient to return to clinic in 4 weeks Glucose test NV TCA treatment for rectal HPV, consider repeat treatments every week for 4-6 weeks. High risk [place] next week scheduled Reviewed warning signs in pregnancy. Patient to call with concerns PRN. Reviewed triage location.  20 min spent with patient greater than 80% spent in counseling and coordination of care.   Liticia Gasior Wilson Singer CNM

## 2013-02-28 ENCOUNTER — Other Ambulatory Visit: Payer: Self-pay | Admitting: Family Medicine

## 2013-02-28 ENCOUNTER — Encounter: Payer: Self-pay | Admitting: Advanced Practice Midwife

## 2013-02-28 DIAGNOSIS — IMO0002 Reserved for concepts with insufficient information to code with codable children: Secondary | ICD-10-CM

## 2013-02-28 DIAGNOSIS — O09529 Supervision of elderly multigravida, unspecified trimester: Secondary | ICD-10-CM

## 2013-02-28 DIAGNOSIS — O169 Unspecified maternal hypertension, unspecified trimester: Secondary | ICD-10-CM

## 2013-02-28 DIAGNOSIS — Z0489 Encounter for examination and observation for other specified reasons: Secondary | ICD-10-CM

## 2013-03-01 ENCOUNTER — Encounter: Payer: Self-pay | Admitting: *Deleted

## 2013-03-01 ENCOUNTER — Other Ambulatory Visit: Payer: Self-pay | Admitting: *Deleted

## 2013-03-01 ENCOUNTER — Encounter: Payer: Self-pay | Admitting: Advanced Practice Midwife

## 2013-03-01 DIAGNOSIS — B373 Candidiasis of vulva and vagina: Secondary | ICD-10-CM

## 2013-03-01 MED ORDER — FLUCONAZOLE 150 MG PO TABS: 150.0000 mg | ORAL_TABLET | Freq: Once | ORAL | Status: DC

## 2013-03-02 ENCOUNTER — Ambulatory Visit (HOSPITAL_COMMUNITY)
Admission: RE | Admit: 2013-03-02 | Discharge: 2013-03-02 | Disposition: A | Discharge status: Home / Self Care | Payer: Medicaid Other | Source: Ambulatory Visit | Attending: Advanced Practice Midwife | Admitting: Advanced Practice Midwife

## 2013-03-02 VITALS — BP 114/68 | HR 97 | Wt 196.5 lb

## 2013-03-02 DIAGNOSIS — O09529 Supervision of elderly multigravida, unspecified trimester: Secondary | ICD-10-CM | POA: Insufficient documentation

## 2013-03-02 DIAGNOSIS — O99330 Smoking (tobacco) complicating pregnancy, unspecified trimester: Secondary | ICD-10-CM | POA: Insufficient documentation

## 2013-03-02 DIAGNOSIS — O360990 Maternal care for other rhesus isoimmunization, unspecified trimester, not applicable or unspecified: Secondary | ICD-10-CM | POA: Insufficient documentation

## 2013-03-02 DIAGNOSIS — A630 Anogenital (venereal) warts: Secondary | ICD-10-CM

## 2013-03-02 DIAGNOSIS — Z0489 Encounter for examination and observation for other specified reasons: Secondary | ICD-10-CM

## 2013-03-02 DIAGNOSIS — IMO0002 Reserved for concepts with insufficient information to code with codable children: Secondary | ICD-10-CM

## 2013-03-02 DIAGNOSIS — F17200 Nicotine dependence, unspecified, uncomplicated: Secondary | ICD-10-CM

## 2013-03-02 DIAGNOSIS — I10 Essential (primary) hypertension: Secondary | ICD-10-CM

## 2013-03-02 DIAGNOSIS — O169 Unspecified maternal hypertension, unspecified trimester: Secondary | ICD-10-CM

## 2013-03-02 DIAGNOSIS — O0992 Supervision of high risk pregnancy, unspecified, second trimester: Secondary | ICD-10-CM

## 2013-03-25 ENCOUNTER — Other Ambulatory Visit: Payer: Medicaid Other

## 2013-03-25 ENCOUNTER — Encounter: Payer: Medicaid Other | Admitting: Advanced Practice Midwife

## 2013-03-28 ENCOUNTER — Ambulatory Visit (INDEPENDENT_AMBULATORY_CARE_PROVIDER_SITE_OTHER): Payer: Medicaid Other | Admitting: Advanced Practice Midwife

## 2013-03-28 ENCOUNTER — Other Ambulatory Visit: Payer: Medicaid Other

## 2013-03-28 VITALS — BP 134/82 | Temp 97.8°F | Wt 196.0 lb

## 2013-03-28 DIAGNOSIS — Z3482 Encounter for supervision of other normal pregnancy, second trimester: Secondary | ICD-10-CM

## 2013-03-28 DIAGNOSIS — Z3480 Encounter for supervision of other normal pregnancy, unspecified trimester: Secondary | ICD-10-CM

## 2013-03-28 LAB — POCT URINALYSIS DIPSTICK
Nitrite, UA: NEGATIVE
Urobilinogen, UA: NEGATIVE
pH, UA: 5

## 2013-03-28 LAB — CBC
MCHC: 35.2 g/dL (ref 30.0–36.0)
MCV: 86.8 fL (ref 78.0–100.0)
Platelets: 250 10*3/uL (ref 150–400)
RBC: 4.55 MIL/uL (ref 3.87–5.11)
RDW: 14.4 % (ref 11.5–15.5)
WBC: 15.9 10*3/uL — ABNORMAL HIGH (ref 4.0–10.5)

## 2013-03-28 NOTE — Progress Notes (Signed)
Pulse: 90

## 2013-03-28 NOTE — Progress Notes (Signed)
Routine Obstetrical Visit  Subjective:    Alicia Greene is being seen today for her routine obstetrical visit. She is at [redacted]w[redacted]d gestation.   Patient reports no complaints.  Had dental work this week that has significantly helped and she feels improvement already and increased ability to chew and eat. Reports being more grumpy than normal.   Objective:     BP 134/82  Temp(Src) 97.8 F (36.6 C)  Wt 196 lb (88.905 kg)  LMP 10/14/2012 Physical Exam  Exam  FHR 140 FH 23    Assessment:    Pregnancy: [id number] Patient Active Problem List   Diagnosis Date Noted  . HPV (human papilloma virus) anogenital infection 02/25/2013  . High-risk pregnancy in second trimester 02/25/2013  . GBS (group B streptococcus) UTI complicating pregnancy 12/31/2012  . Rh negative state in antepartum period 12/14/2012  . 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 Smoker 12/03/2012  . Supervision of other normal pregnancy 12/03/2012  . AMA (advanced maternal age) multigravida 35+ 12/03/2012  . High blood pressure 12/03/2012       Plan:     Prenatal vitamins. Problem list reviewed and updated.  Repeat US, PRN. Continue to monitor patients dental improvements, weight and eating ability. Follow up in 4 weeks. Discuss birth control next visit. 80% of 15 min visit spent on counseling and coordination of care.     Anselmo Reihl 03/28/2013

## 2013-03-29 LAB — RPR

## 2013-03-29 LAB — GLUCOSE TOLERANCE, 2 HOURS W/ 1HR
Glucose, 1 hour: 164 mg/dL (ref 70–170)
Glucose, 2 hour: 121 mg/dL (ref 70–139)
Glucose, Fasting: 71 mg/dL (ref 70–99)

## 2013-03-29 LAB — HIV ANTIBODY (ROUTINE TESTING W REFLEX): HIV: NONREACTIVE

## 2013-04-04 ENCOUNTER — Encounter: Payer: Medicaid Other | Admitting: Advanced Practice Midwife

## 2013-04-07 NOTE — L&D Delivery Note (Signed)
Delivery Note At 11:17 AM a viable female was delivered via  (Presentation: ROA, compound presentation with posterior arm).     Placenta status: an attempt to deliver with cord traction was made; there was a focal area that was mildly adherent--able to establish a cleavage plane; the placenta was removed in fragments--felt to be completely removed.  Cord:  with the following complications: none  Anesthesia:  Epidural/local Episiotomy: None Lacerations: Periurethral Suture Repair: 3.0 vicryl rapide Est. Blood Loss (mL): 200 ml  Mom to postpartum.  Baby to Couplet care / Skin to Skin.  JACKSON-MOORE,Maezie Justin A 06/25/2013, 11:40 AM

## 2013-04-29 ENCOUNTER — Ambulatory Visit (INDEPENDENT_AMBULATORY_CARE_PROVIDER_SITE_OTHER): Payer: Medicaid Other | Admitting: Advanced Practice Midwife

## 2013-04-29 VITALS — BP 120/80 | Temp 97.9°F | Wt 201.0 lb

## 2013-04-29 DIAGNOSIS — O360990 Maternal care for other rhesus isoimmunization, unspecified trimester, not applicable or unspecified: Secondary | ICD-10-CM

## 2013-04-29 DIAGNOSIS — Z6791 Unspecified blood type, Rh negative: Secondary | ICD-10-CM

## 2013-04-29 DIAGNOSIS — B977 Papillomavirus as the cause of diseases classified elsewhere: Secondary | ICD-10-CM

## 2013-04-29 DIAGNOSIS — O26893 Other specified pregnancy related conditions, third trimester: Secondary | ICD-10-CM

## 2013-04-29 DIAGNOSIS — Z3480 Encounter for supervision of other normal pregnancy, unspecified trimester: Secondary | ICD-10-CM

## 2013-04-29 DIAGNOSIS — A630 Anogenital (venereal) warts: Secondary | ICD-10-CM

## 2013-04-29 LAB — POCT URINALYSIS DIPSTICK
Bilirubin, UA: NEGATIVE
Glucose, UA: NEGATIVE
KETONES UA: NEGATIVE
Nitrite, UA: NEGATIVE
SPEC GRAV UA: 1.02
Urobilinogen, UA: NEGATIVE
pH, UA: 6

## 2013-04-29 MED ORDER — RHO D IMMUNE GLOBULIN 300 MCG IM INJ
300.0000 ug | INJECTION | Freq: Once | INTRAMUSCULAR | Status: AC
  Administered 2013-04-29: 300 ug via INTRAMUSCULAR

## 2013-04-29 NOTE — Progress Notes (Signed)
Subjective: Alicia Greene is a [age] y.o. at 28 weeks  Patient denies vaginal leaking of fluid or bleeding, denies contractions.  Reports positive fetal movment.  Denies concerns today.  Objective: Filed Vitals:   04/29/13 1110  BP: 120/80  Temp:    150 FHR 28 Fundal Height Fetal Position cephalic  Rectal HPV, 1 large lesion approx 2cm cluster, base < 1 cm. TCA applied to site and 3-4 small sites around. Patient tolerated well. Assessment: Patient Active Problem List   Diagnosis Date Noted  . HPV (human papilloma virus) anogenital infection 02/25/2013  . High-risk pregnancy in second trimester 02/25/2013  . GBS (group B streptococcus) UTI complicating pregnancy 12/31/2012  . Rh negative state in antepartum period 12/14/2012  . 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 Smoker 12/03/2012  . Supervision of other normal pregnancy 12/03/2012  . AMA (advanced maternal age) multigravida 35+ 12/03/2012  . High blood pressure 12/03/2012    Plan: Patient to return to clinic in 2 weeks Rhogam today TCA treatment, plan aldera cream pp. Reviewed warning signs in pregnancy. Patient to call with concerns PRN. Reviewed triage location.  20 min spent with patient greater than 80% spent in counseling and coordination of care.   Lavanya Roa Wilson SingerWren CNM

## 2013-04-29 NOTE — Progress Notes (Signed)
HR - 80 Pt in office today for routine OB visit, states she is feeling much better, able to eat better, have had some pressure and cramping for the past couple weeks.

## 2013-05-13 ENCOUNTER — Encounter: Payer: Medicaid Other | Admitting: Advanced Practice Midwife

## 2013-05-27 ENCOUNTER — Encounter (HOSPITAL_COMMUNITY): Payer: Self-pay | Admitting: Advanced Practice Midwife

## 2013-05-27 ENCOUNTER — Ambulatory Visit (INDEPENDENT_AMBULATORY_CARE_PROVIDER_SITE_OTHER): Payer: Medicaid Other | Admitting: Advanced Practice Midwife

## 2013-05-27 VITALS — BP 126/84 | Temp 97.6°F | Wt 207.0 lb

## 2013-05-27 DIAGNOSIS — O09529 Supervision of elderly multigravida, unspecified trimester: Secondary | ICD-10-CM

## 2013-05-27 DIAGNOSIS — F17200 Nicotine dependence, unspecified, uncomplicated: Secondary | ICD-10-CM

## 2013-05-27 DIAGNOSIS — Z3480 Encounter for supervision of other normal pregnancy, unspecified trimester: Secondary | ICD-10-CM

## 2013-05-27 LAB — POCT URINALYSIS DIPSTICK
Bilirubin, UA: NEGATIVE
GLUCOSE UA: NEGATIVE
Ketones, UA: NEGATIVE
Leukocytes, UA: NEGATIVE
NITRITE UA: NEGATIVE
Protein, UA: NEGATIVE
RBC UA: NEGATIVE
SPEC GRAV UA: 1.01
UROBILINOGEN UA: NEGATIVE
pH, UA: 7

## 2013-05-27 NOTE — Progress Notes (Signed)
Pulse 66 Pt states that she is doing well.

## 2013-05-27 NOTE — Progress Notes (Signed)
Subjective: Alicia Greene is a 38 y.o. at 32 weeks by LMP  Patient denies vaginal leaking of fluid or bleeding, denies contractions.  Reports positive fetal movment.  Denies concerns today. Reports no change in genital warts in size.  Objective: Filed Vitals:   05/27/13 1034  BP: 126/84  Temp: 97.6 F (36.4 C)   150 FHR 32 Fundal Height Fetal Position cephalic  Assessment: Patient Active Problem List   Diagnosis Date Noted  . HPV (human papilloma virus) anogenital infection 02/25/2013  . High-risk pregnancy in second trimester 02/25/2013  . GBS (group B streptococcus) UTI complicating pregnancy 12/31/2012  . Rh negative state in antepartum period 12/14/2012  . 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 Smoker 12/03/2012  . Supervision of other normal pregnancy 12/03/2012  . AMA (advanced maternal age) multigravida 35+ 12/03/2012  . High blood pressure 12/03/2012    Plan: Patient to return to clinic in 2 weeks MFM consult and growth US for smoking and AMA. Recommended consult in early pregnancy that was never completed. Desires recommendations of antenatal NSTs are necessary. Plan treatment for Genital warts postpartum, may need removal then treatment following removal of large lesions. Reviewed warning signs in pregnancy. Patient to call with concerns PRN. Reviewed triage location.  15 min spent with patient greater than 80% spent in counseling and coordination of care.    Denaja Verhoeven Wilson SingerWren CNM

## 2013-06-03 ENCOUNTER — Other Ambulatory Visit: Payer: Self-pay | Admitting: Advanced Practice Midwife

## 2013-06-03 DIAGNOSIS — O09529 Supervision of elderly multigravida, unspecified trimester: Secondary | ICD-10-CM

## 2013-06-03 DIAGNOSIS — O99340 Other mental disorders complicating pregnancy, unspecified trimester: Secondary | ICD-10-CM

## 2013-06-07 ENCOUNTER — Ambulatory Visit (HOSPITAL_COMMUNITY): Admission: RE | Admit: 2013-06-07 | Payer: Medicaid Other | Source: Ambulatory Visit

## 2013-06-07 ENCOUNTER — Encounter (HOSPITAL_COMMUNITY): Payer: Self-pay

## 2013-06-07 ENCOUNTER — Ambulatory Visit (HOSPITAL_COMMUNITY)
Admission: RE | Admit: 2013-06-07 | Discharge: 2013-06-07 | Disposition: A | Discharge status: Home / Self Care | Payer: Medicaid Other | Source: Ambulatory Visit | Attending: Advanced Practice Midwife | Admitting: Advanced Practice Midwife

## 2013-06-07 VITALS — BP 141/85 | HR 83 | Wt 208.0 lb

## 2013-06-07 DIAGNOSIS — IMO0002 Reserved for concepts with insufficient information to code with codable children: Secondary | ICD-10-CM

## 2013-06-07 DIAGNOSIS — O26899 Other specified pregnancy related conditions, unspecified trimester: Secondary | ICD-10-CM

## 2013-06-07 DIAGNOSIS — A630 Anogenital (venereal) warts: Secondary | ICD-10-CM

## 2013-06-07 DIAGNOSIS — I10 Essential (primary) hypertension: Secondary | ICD-10-CM

## 2013-06-07 DIAGNOSIS — O09529 Supervision of elderly multigravida, unspecified trimester: Secondary | ICD-10-CM | POA: Insufficient documentation

## 2013-06-07 DIAGNOSIS — O0992 Supervision of high risk pregnancy, unspecified, second trimester: Secondary | ICD-10-CM

## 2013-06-07 DIAGNOSIS — O99330 Smoking (tobacco) complicating pregnancy, unspecified trimester: Secondary | ICD-10-CM | POA: Insufficient documentation

## 2013-06-07 DIAGNOSIS — Z3480 Encounter for supervision of other normal pregnancy, unspecified trimester: Secondary | ICD-10-CM

## 2013-06-07 DIAGNOSIS — Z6791 Unspecified blood type, Rh negative: Secondary | ICD-10-CM

## 2013-06-07 DIAGNOSIS — B951 Streptococcus, group B, as the cause of diseases classified elsewhere: Secondary | ICD-10-CM

## 2013-06-07 DIAGNOSIS — O99340 Other mental disorders complicating pregnancy, unspecified trimester: Secondary | ICD-10-CM

## 2013-06-07 DIAGNOSIS — F17200 Nicotine dependence, unspecified, uncomplicated: Secondary | ICD-10-CM

## 2013-06-07 DIAGNOSIS — O2340 Unspecified infection of urinary tract in pregnancy, unspecified trimester: Secondary | ICD-10-CM

## 2013-06-10 ENCOUNTER — Ambulatory Visit (INDEPENDENT_AMBULATORY_CARE_PROVIDER_SITE_OTHER): Payer: Medicaid Other | Admitting: Advanced Practice Midwife

## 2013-06-10 VITALS — BP 118/82 | Temp 98.0°F | Wt 206.0 lb

## 2013-06-10 DIAGNOSIS — Z3480 Encounter for supervision of other normal pregnancy, unspecified trimester: Secondary | ICD-10-CM

## 2013-06-10 DIAGNOSIS — Z23 Encounter for immunization: Secondary | ICD-10-CM

## 2013-06-10 LAB — POCT URINALYSIS DIPSTICK
Bilirubin, UA: NEGATIVE
Blood, UA: NEGATIVE
Glucose, UA: NEGATIVE
KETONES UA: NEGATIVE
Leukocytes, UA: NEGATIVE
Nitrite, UA: NEGATIVE
PH UA: 5
PROTEIN UA: NEGATIVE
SPEC GRAV UA: 1.025
Urobilinogen, UA: NEGATIVE

## 2013-06-10 MED ORDER — TETANUS-DIPHTH-ACELL PERTUSSIS 5-2.5-18.5 LF-MCG/0.5 IM SUSP: 0.5000 mL | Freq: Once | INTRAMUSCULAR | Status: DC

## 2013-06-10 NOTE — Progress Notes (Signed)
Pulse: 94 Patient denies any concerns. Patient would like ultrasound results if they are in.

## 2013-06-10 NOTE — Progress Notes (Signed)
Subjective: Alicia Greene is a 38 y.o. at 34 weeks by LMP  Patient denies vaginal leaking of fluid or bleeding, denies contractions.  Reports positive fetal movment.  Denies concerns today.  Objective: Filed Vitals:   06/10/13 1102  BP: 118/82  Temp: 98 F (36.7 C)   150 FHR 34 Fundal Height Fetal Position cephalic  Assessment: Patient Active Problem List   Diagnosis Date Noted  . HPV (human papilloma virus) anogenital infection 02/25/2013  . High-risk pregnancy in second trimester 02/25/2013  . GBS (group B streptococcus) UTI complicating pregnancy 12/31/2012  . Rh negative state in antepartum period 12/14/2012  . 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 Smoker 12/03/2012  . Supervision of other normal pregnancy 12/03/2012  . AMA (advanced maternal age) multigravida 35+ 12/03/2012  . High blood pressure 12/03/2012    Plan: Patient to return to clinic in 2 weeks GBS not indicated will treat on L&D Growth US in 4 weeks, reviewed today [facility] Reviewed warning signs in pregnancy. Patient to call with concerns PRN. Reviewed triage location.  20 min spent with patient greater than 80% spent in counseling and coordination of care.   Lanecia Sliva Wilson SingerWren CNM

## 2013-06-10 NOTE — Progress Notes (Signed)
Tdap given at today's visit. Pt tolerated well.

## 2013-06-16 ENCOUNTER — Encounter: Payer: Self-pay | Admitting: *Deleted

## 2013-06-22 ENCOUNTER — Ambulatory Visit (INDEPENDENT_AMBULATORY_CARE_PROVIDER_SITE_OTHER): Payer: Medicaid Other | Admitting: Obstetrics

## 2013-06-22 VITALS — BP 147/88 | Temp 98.5°F | Wt 209.0 lb

## 2013-06-22 DIAGNOSIS — Z3480 Encounter for supervision of other normal pregnancy, unspecified trimester: Secondary | ICD-10-CM

## 2013-06-22 LAB — POCT URINALYSIS DIPSTICK
BILIRUBIN UA: NEGATIVE
Glucose, UA: NEGATIVE
KETONES UA: NEGATIVE
Nitrite, UA: NEGATIVE
PH UA: 6
PROTEIN UA: NEGATIVE
RBC UA: NEGATIVE
SPEC GRAV UA: 1.02
Urobilinogen, UA: NEGATIVE

## 2013-06-22 NOTE — Progress Notes (Signed)
Pulse: 92 Patient states she had some braxton hick Monday. Patient denies any concerns.

## 2013-06-23 ENCOUNTER — Encounter: Payer: Self-pay | Admitting: Obstetrics

## 2013-06-24 ENCOUNTER — Encounter: Payer: Medicaid Other | Admitting: Advanced Practice Midwife

## 2013-06-25 ENCOUNTER — Encounter (HOSPITAL_COMMUNITY): Payer: Self-pay

## 2013-06-25 ENCOUNTER — Inpatient Hospital Stay (HOSPITAL_COMMUNITY)
Admission: AD | Admit: 2013-06-25 | Discharge: 2013-06-27 | DRG: 775 | Disposition: A | Discharge status: Home / Self Care | Payer: Medicaid Other | Source: Ambulatory Visit | Attending: Obstetrics & Gynecology | Admitting: Obstetrics & Gynecology

## 2013-06-25 ENCOUNTER — Inpatient Hospital Stay (HOSPITAL_COMMUNITY): Payer: Medicaid Other | Admitting: Anesthesiology

## 2013-06-25 ENCOUNTER — Encounter (HOSPITAL_COMMUNITY): Payer: Medicaid Other | Admitting: Anesthesiology

## 2013-06-25 DIAGNOSIS — O26899 Other specified pregnancy related conditions, unspecified trimester: Secondary | ICD-10-CM

## 2013-06-25 DIAGNOSIS — O328XX0 Maternal care for other malpresentation of fetus, not applicable or unspecified: Secondary | ICD-10-CM | POA: Diagnosis present

## 2013-06-25 DIAGNOSIS — Z3480 Encounter for supervision of other normal pregnancy, unspecified trimester: Secondary | ICD-10-CM

## 2013-06-25 DIAGNOSIS — O2340 Unspecified infection of urinary tract in pregnancy, unspecified trimester: Secondary | ICD-10-CM

## 2013-06-25 DIAGNOSIS — O0992 Supervision of high risk pregnancy, unspecified, second trimester: Secondary | ICD-10-CM

## 2013-06-25 DIAGNOSIS — O09529 Supervision of elderly multigravida, unspecified trimester: Secondary | ICD-10-CM

## 2013-06-25 DIAGNOSIS — O99892 Other specified diseases and conditions complicating childbirth: Secondary | ICD-10-CM | POA: Diagnosis present

## 2013-06-25 DIAGNOSIS — IMO0002 Reserved for concepts with insufficient information to code with codable children: Secondary | ICD-10-CM

## 2013-06-25 DIAGNOSIS — O99334 Smoking (tobacco) complicating childbirth: Secondary | ICD-10-CM | POA: Diagnosis present

## 2013-06-25 DIAGNOSIS — A630 Anogenital (venereal) warts: Secondary | ICD-10-CM

## 2013-06-25 DIAGNOSIS — B951 Streptococcus, group B, as the cause of diseases classified elsewhere: Secondary | ICD-10-CM

## 2013-06-25 DIAGNOSIS — Z6791 Unspecified blood type, Rh negative: Secondary | ICD-10-CM

## 2013-06-25 DIAGNOSIS — F17200 Nicotine dependence, unspecified, uncomplicated: Secondary | ICD-10-CM

## 2013-06-25 DIAGNOSIS — I10 Essential (primary) hypertension: Secondary | ICD-10-CM

## 2013-06-25 DIAGNOSIS — Z22330 Carrier of Group B streptococcus: Secondary | ICD-10-CM

## 2013-06-25 DIAGNOSIS — O9989 Other specified diseases and conditions complicating pregnancy, childbirth and the puerperium: Secondary | ICD-10-CM

## 2013-06-25 DIAGNOSIS — O139 Gestational [pregnancy-induced] hypertension without significant proteinuria, unspecified trimester: Principal | ICD-10-CM | POA: Diagnosis present

## 2013-06-25 HISTORY — DX: Anogenital (venereal) warts: A63.0

## 2013-06-25 LAB — COMPREHENSIVE METABOLIC PANEL
ALT: 9 U/L (ref 0–35)
AST: 15 U/L (ref 0–37)
Albumin: 2.7 g/dL — ABNORMAL LOW (ref 3.5–5.2)
Alkaline Phosphatase: 126 U/L — ABNORMAL HIGH (ref 39–117)
BUN: 7 mg/dL (ref 6–23)
CALCIUM: 9.2 mg/dL (ref 8.4–10.5)
CO2: 21 meq/L (ref 19–32)
CREATININE: 0.57 mg/dL (ref 0.50–1.10)
Chloride: 102 mEq/L (ref 96–112)
GFR calc non Af Amer: >90 mL/min (ref >90)
GLUCOSE: 88 mg/dL (ref 70–99)
Potassium: 4 mEq/L (ref 3.7–5.3)
Sodium: 136 mEq/L — ABNORMAL LOW (ref 137–147)
TOTAL PROTEIN: 6.5 g/dL (ref 6.0–8.3)
Total Bilirubin: <0.2 mg/dL — ABNORMAL LOW (ref 0.3–1.2)

## 2013-06-25 LAB — RPR: RPR: NONREACTIVE

## 2013-06-25 LAB — CBC
HCT: 39.9 % (ref 36.0–46.0)
HEMATOCRIT: 41.9 % (ref 36.0–46.0)
HEMOGLOBIN: 14.5 g/dL (ref 12.0–15.0)
Hemoglobin: 13.8 g/dL (ref 12.0–15.0)
MCH: 30.4 pg (ref 26.0–34.0)
MCH: 31 pg (ref 26.0–34.0)
MCHC: 34.6 g/dL (ref 30.0–36.0)
MCHC: 34.6 g/dL (ref 30.0–36.0)
MCV: 87.8 fL (ref 78.0–100.0)
MCV: 89.7 fL (ref 78.0–100.0)
Platelets: 152 10*3/uL (ref 150–400)
Platelets: 137 10*3/uL — ABNORMAL LOW (ref 150–400)
RBC: 4.77 MIL/uL (ref 3.87–5.11)
RBC: 4.45 MIL/uL (ref 3.87–5.11)
RDW: 14.8 % (ref 11.5–15.5)
RDW: 14.1 % (ref 11.5–15.5)
WBC: 13.9 10*3/uL — AB (ref 4.0–10.5)
WBC: 15.3 10*3/uL — AB (ref 4.0–10.5)

## 2013-06-25 LAB — LACTATE DEHYDROGENASE: LDH: 180 U/L (ref 94–250)

## 2013-06-25 MED ORDER — PHENYLEPHRINE 40 MCG/ML (10ML) SYRINGE FOR IV PUSH (FOR BLOOD PRESSURE SUPPORT)
80.0000 ug | PREFILLED_SYRINGE | INTRAVENOUS | Status: DC | PRN
  Filled 2013-06-25: qty 2

## 2013-06-25 MED ORDER — MAGNESIUM HYDROXIDE 400 MG/5ML PO SUSP: 30.0000 mL | ORAL | Status: DC | PRN

## 2013-06-25 MED ORDER — OXYCODONE-ACETAMINOPHEN 5-325 MG PO TABS
1.0000 | ORAL_TABLET | ORAL | Status: DC | PRN
Start: 2013-06-25 — End: 2013-06-27
  Administered 2013-06-26 (×3): 1 via ORAL
  Filled 2013-06-25 (×3): qty 1

## 2013-06-25 MED ORDER — WITCH HAZEL-GLYCERIN EX PADS: 1.0000 "application " | MEDICATED_PAD | CUTANEOUS | Status: DC | PRN

## 2013-06-25 MED ORDER — PENICILLIN G POTASSIUM 5000000 UNITS IJ SOLR
5.0000 10*6.[IU] | Freq: Once | INTRAVENOUS | Status: AC
  Administered 2013-06-25: 5 10*6.[IU] via INTRAVENOUS
  Filled 2013-06-25: qty 5

## 2013-06-25 MED ORDER — BUTORPHANOL TARTRATE 1 MG/ML IJ SOLN: 1.0000 mg | Freq: Once | INTRAMUSCULAR | Status: DC

## 2013-06-25 MED ORDER — BUTORPHANOL TARTRATE 1 MG/ML IJ SOLN
INTRAMUSCULAR | Status: AC
  Administered 2013-06-25: 1 mg
  Filled 2013-06-25: qty 1

## 2013-06-25 MED ORDER — ONDANSETRON HCL 4 MG/2ML IJ SOLN: 4.0000 mg | INTRAMUSCULAR | Status: DC | PRN

## 2013-06-25 MED ORDER — DIBUCAINE 1 % RE OINT: 1.0000 "application " | TOPICAL_OINTMENT | RECTAL | Status: DC | PRN

## 2013-06-25 MED ORDER — OXYTOCIN BOLUS FROM INFUSION: 500.0000 mL | Freq: Once | INTRAVENOUS | Status: DC | PRN

## 2013-06-25 MED ORDER — EPHEDRINE 5 MG/ML INJ
10.0000 mg | INTRAVENOUS | Status: DC | PRN
  Filled 2013-06-25: qty 2

## 2013-06-25 MED ORDER — BUTORPHANOL TARTRATE 1 MG/ML IJ SOLN
2.0000 mg | Freq: Once | INTRAMUSCULAR | Status: AC
  Administered 2013-06-25: 2 mg via INTRAVENOUS

## 2013-06-25 MED ORDER — PHENYLEPHRINE 40 MCG/ML (10ML) SYRINGE FOR IV PUSH (FOR BLOOD PRESSURE SUPPORT)
80.0000 ug | PREFILLED_SYRINGE | INTRAVENOUS | Status: DC | PRN
Start: 2013-06-25 — End: 2013-06-25
  Filled 2013-06-25: qty 2
  Filled 2013-06-25: qty 10

## 2013-06-25 MED ORDER — BENZOCAINE-MENTHOL 20-0.5 % EX AERO
1.0000 "application " | INHALATION_SPRAY | CUTANEOUS | Status: DC | PRN
  Administered 2013-06-25: 1 via TOPICAL
  Filled 2013-06-25: qty 56

## 2013-06-25 MED ORDER — ZOLPIDEM TARTRATE 5 MG PO TABS: 5.0000 mg | ORAL_TABLET | Freq: Every evening | ORAL | Status: DC | PRN

## 2013-06-25 MED ORDER — ONDANSETRON HCL 4 MG PO TABS
4.0000 mg | ORAL_TABLET | ORAL | Status: DC | PRN
Start: 2013-06-25 — End: 2013-06-27

## 2013-06-25 MED ORDER — DIPHENHYDRAMINE HCL 50 MG/ML IJ SOLN: 12.5000 mg | INTRAMUSCULAR | Status: DC | PRN

## 2013-06-25 MED ORDER — FENTANYL 2.5 MCG/ML BUPIVACAINE 1/10 % EPIDURAL INFUSION (WH - ANES)
14.0000 mL/h | INTRAMUSCULAR | Status: DC | PRN
  Filled 2013-06-25: qty 125

## 2013-06-25 MED ORDER — LIDOCAINE HCL (PF) 1 % IJ SOLN
30.0000 mL | INTRAMUSCULAR | Status: AC | PRN
  Administered 2013-06-25: 30 mL via SUBCUTANEOUS
  Filled 2013-06-25: qty 30

## 2013-06-25 MED ORDER — IBUPROFEN 600 MG PO TABS
600.0000 mg | ORAL_TABLET | Freq: Four times a day (QID) | ORAL | Status: DC | PRN
  Administered 2013-06-25: 600 mg via ORAL
  Filled 2013-06-25: qty 1

## 2013-06-25 MED ORDER — FENTANYL 2.5 MCG/ML BUPIVACAINE 1/10 % EPIDURAL INFUSION (WH - ANES)
INTRAMUSCULAR | Status: DC | PRN
  Administered 2013-06-25: 14 mL/h via EPIDURAL

## 2013-06-25 MED ORDER — CITRIC ACID-SODIUM CITRATE 334-500 MG/5ML PO SOLN: 30.0000 mL | ORAL | Status: DC | PRN

## 2013-06-25 MED ORDER — MEASLES, MUMPS & RUBELLA VAC ~~LOC~~ INJ
0.5000 mL | INJECTION | Freq: Once | SUBCUTANEOUS | Status: DC
  Filled 2013-06-25: qty 0.5

## 2013-06-25 MED ORDER — DIPHENHYDRAMINE HCL 25 MG PO CAPS: 25.0000 mg | ORAL_CAPSULE | Freq: Four times a day (QID) | ORAL | Status: DC | PRN

## 2013-06-25 MED ORDER — ONDANSETRON HCL 4 MG/2ML IJ SOLN: 4.0000 mg | Freq: Four times a day (QID) | INTRAMUSCULAR | Status: DC | PRN

## 2013-06-25 MED ORDER — LACTATED RINGERS IV SOLN: INTRAVENOUS | Status: DC

## 2013-06-25 MED ORDER — PENICILLIN G POTASSIUM 5000000 UNITS IJ SOLR
2.5000 10*6.[IU] | INTRAMUSCULAR | Status: DC
  Filled 2013-06-25 (×5): qty 2.5

## 2013-06-25 MED ORDER — ACETAMINOPHEN 325 MG PO TABS: 650.0000 mg | ORAL_TABLET | ORAL | Status: DC | PRN

## 2013-06-25 MED ORDER — LACTATED RINGERS IV SOLN
500.0000 mL | Freq: Once | INTRAVENOUS | Status: AC
  Administered 2013-06-25: 500 mL via INTRAVENOUS

## 2013-06-25 MED ORDER — PRENATAL MULTIVITAMIN CH
1.0000 | ORAL_TABLET | Freq: Every day | ORAL | Status: DC
  Administered 2013-06-25 – 2013-06-27 (×3): 1 via ORAL
  Filled 2013-06-25 (×3): qty 1

## 2013-06-25 MED ORDER — OXYTOCIN 40 UNITS IN LACTATED RINGERS INFUSION - SIMPLE MED
62.5000 mL/h | Freq: Once | INTRAVENOUS | Status: DC | PRN
  Filled 2013-06-25: qty 1000

## 2013-06-25 MED ORDER — FERROUS SULFATE 325 (65 FE) MG PO TABS
325.0000 mg | ORAL_TABLET | Freq: Two times a day (BID) | ORAL | Status: DC
  Administered 2013-06-25 – 2013-06-27 (×4): 325 mg via ORAL
  Filled 2013-06-25 (×4): qty 1

## 2013-06-25 MED ORDER — SENNOSIDES-DOCUSATE SODIUM 8.6-50 MG PO TABS
2.0000 | ORAL_TABLET | ORAL | Status: DC
  Administered 2013-06-26: 2 via ORAL
  Filled 2013-06-25 (×2): qty 2

## 2013-06-25 MED ORDER — OXYCODONE-ACETAMINOPHEN 5-325 MG PO TABS: 1.0000 | ORAL_TABLET | ORAL | Status: DC | PRN

## 2013-06-25 MED ORDER — BUTORPHANOL TARTRATE 1 MG/ML IJ SOLN
INTRAMUSCULAR | Status: AC
  Filled 2013-06-25: qty 2

## 2013-06-25 MED ORDER — LACTATED RINGERS IV SOLN: 500.0000 mL | INTRAVENOUS | Status: DC | PRN

## 2013-06-25 MED ORDER — LANOLIN HYDROUS EX OINT: TOPICAL_OINTMENT | CUTANEOUS | Status: DC | PRN

## 2013-06-25 MED ORDER — IBUPROFEN 600 MG PO TABS
600.0000 mg | ORAL_TABLET | Freq: Four times a day (QID) | ORAL | Status: DC
  Administered 2013-06-25 – 2013-06-27 (×8): 600 mg via ORAL
  Filled 2013-06-25 (×8): qty 1

## 2013-06-25 MED ORDER — LIDOCAINE HCL (PF) 1 % IJ SOLN
INTRAMUSCULAR | Status: DC | PRN
  Administered 2013-06-25 (×2): 4 mL

## 2013-06-25 NOTE — Lactation Note (Signed)
This note was copied from the chart of Alicia Ardyth GalJennifer Seyfried. Lactation Consultation Note Initial visit at 5 hours of age.  Mom reports a previous good latch and feeding.  Baby is asleep in blanket, but mom request assist with making sure latch is good.  Baby is sleepy and not latching, but placed STS on her chest.  Hand expression demonstrated by mom with visible colostrum.  Hand pump given for breast stimulation as needed.  Baby is 5#3oz and mom is a smoker.  Discussed smoking and breastfeeding at length. Discussed feeding frequency and output.  CBG's being check by nursery and borderline at this time.  Mom is aware of feeding cues.  [facility] given and discussed.  Mom to call for assist as needed.    Patient Name: Alicia Greene ONGEX'BToday's Date: 06/25/2013 Reason for consult: Initial assessment   Maternal Data Has patient been taught Hand Expression?: Yes Does the patient have breastfeeding experience prior to this delivery?: Yes  Feeding Feeding Type: Breast Fed Length of feed: 90 min  LATCH Score/Interventions Latch: Grasps breast easily, tongue down, lips flanged, rhythmical sucking.  Audible Swallowing: Spontaneous and intermittent Intervention(s): Skin to skin;Hand expression;Alternate breast massage  Type of Nipple: Everted at rest and after stimulation  Comfort (Breast/Nipple): Soft / non-tender     Hold (Positioning): Assistance needed to correctly position infant at breast and maintain latch. Intervention(s): Breastfeeding basics reviewed;Support Pillows;Position options;Skin to skin  LATCH Score: 9  Lactation Tools Discussed/Used Pump Review: Setup, frequency, and cleaning Initiated by:: JS Date initiated:: 06/25/13   Consult Status Consult Status: Follow-up Date: 06/26/13    Alicia Greene, Alicia Greene 06/25/2013, 5:01 PM

## 2013-06-25 NOTE — Progress Notes (Signed)
Delivery of live viable female by Dr Tamela OddiJackson Moore, APGARS 9,9

## 2013-06-25 NOTE — Anesthesia Preprocedure Evaluation (Signed)
Anesthesia Evaluation  Patient identified by MRN, date of birth, ID band Patient awake    Reviewed: Allergy & Precautions, H&P , Patient's Chart, lab work & pertinent test results  Airway Mallampati: III TM Distance: >3 FB Neck ROM: full    Dental no notable dental hx. (+) Teeth Intact   Pulmonary Current Smoker,  breath sounds clear to auscultation  Pulmonary exam normal       Cardiovascular hypertension, negative cardio ROS  Rhythm:regular Rate:Normal     Neuro/Psych negative neurological ROS  negative psych ROS   GI/Hepatic negative GI ROS, Neg liver ROS,   Endo/Other  negative endocrine ROS  Renal/GU negative Renal ROS  negative genitourinary   Musculoskeletal   Abdominal Normal abdominal exam  (+)   Peds  Hematology negative hematology ROS (+)   Anesthesia Other Findings   Reproductive/Obstetrics (+) Pregnancy                           Anesthesia Physical Anesthesia Plan  ASA: II  Anesthesia Plan: Epidural   Post-op Pain Management:    Induction:   Airway Management Planned:   Additional Equipment:   Intra-op Plan:   Post-operative Plan:   Informed Consent: I have reviewed the patients History and Physical, chart, labs and discussed the procedure including the risks, benefits and alternatives for the proposed anesthesia with the patient or authorized representative who has indicated his/her understanding and acceptance.     Plan Discussed with: Anesthesiologist  Anesthesia Plan Comments:         Anesthesia Quick Evaluation

## 2013-06-25 NOTE — H&P (Signed)
Alicia Greene is a 38 y.o. female presenting for contractions. Maternal Medical History:  Reason for admission: Contractions.   Contractions: Perceived severity is strong.    Fetal activity: Perceived fetal activity is normal.    Prenatal complications: PIH.   Prenatal Complications - Diabetes: none.    OB History   Grav Para Term Preterm Abortions TAB SAB Ect Mult Living   5 3 3  1  1   3      Past Medical History  Diagnosis Date  . Bronchitis    Past Surgical History  Procedure Laterality Date  . No past surgeries     Family History: family history includes Diabetes in her father and paternal grandmother; Hearing loss in her maternal grandfather, mother, and sister; Heart disease in her maternal grandmother; Hypertension in her mother; Stroke in her paternal grandmother. Social History:  reports that she has been smoking Cigarettes.  She has a 7.5 pack-year smoking history. She has never used smokeless tobacco. She reports that she does not drink alcohol or use illicit drugs.     Review of Systems  Constitutional: Negative for fever.  Eyes: Negative for blurred vision.  Respiratory: Negative for shortness of breath.   Gastrointestinal: Negative for vomiting.  Skin: Negative for rash.  Neurological: Negative for headaches.    Dilation: 7 Effacement (%): 90 Station: -1;0 Exam by:: L. McDaniel rN Blood pressure 158/96, pulse 93, temperature 98.9 F (37.2 C), temperature source Oral, resp. rate 18, height 5\' 6"  (1.676 m), weight 94.348 kg (208 lb), last menstrual period 10/14/2012. Maternal Exam:  Abdomen: not evaluated.  Introitus: not evaluated.   Cervix: Cervix evaluated by digital exam.     Fetal Exam Fetal Monitor Review: Variability: moderate (6-25 bpm).   Pattern: accelerations present.    Fetal State Assessment: Category I - tracings are normal.     Physical Exam  Constitutional: She appears well-developed.  HENT:  Head: Normocephalic.  Neck:  Neck supple. No thyromegaly present.  Cardiovascular: Normal rate and regular rhythm.   Respiratory: Breath sounds normal.  GI: Soft. Bowel sounds are normal.  Skin: No rash noted.    Prenatal labs: ABO, Rh: A/NEG/-- (08/29 1521) Antibody: NEG (08/29 1521) Rubella: 1.74 (08/29 1521) RPR: NON REAC (12/22 1411)  HBsAg: NEGATIVE (08/29 1521)  HIV: NON REACTIVE (12/22 1411)      Assessment/Plan: Multipara at term, active labor, GBS pos, Category 1 FHT Admit, PCN GBS prophylaxis, anticipate an NSVD   JACKSON-MOORE,Charels Stambaugh A 06/25/2013, 9:04 AM

## 2013-06-25 NOTE — Anesthesia Procedure Notes (Signed)
Epidural Patient location during procedure: OB Start time: 06/25/2013 9:12 AM  Staffing Anesthesiologist: Shanel Prazak A. Performed by: anesthesiologist   Preanesthetic Checklist Completed: patient identified, site marked, surgical consent, pre-op evaluation, timeout performed, IV checked, risks and benefits discussed and monitors and equipment checked  Epidural Patient position: sitting Prep: site prepped and draped and DuraPrep Patient monitoring: continuous pulse ox and blood pressure Approach: midline Location: L3-L4 Injection technique: LOR air  Needle:  Needle type: Tuohy  Needle gauge: 17 G Needle length: 9 cm and 9 Needle insertion depth: 5 cm cm Catheter type: closed end flexible Catheter size: 19 Gauge Catheter at skin depth: 10 cm Test dose: negative and Other  Assessment Events: blood not aspirated, injection not painful, no injection resistance, negative IV test and no paresthesia  Additional Notes Patient identified. Risks and benefits discussed including failed block, incomplete  Pain control, post dural puncture headache, nerve damage, paralysis, blood pressure Changes, nausea, vomiting, reactions to medications-both toxic and allergic and post Partum back pain. All questions were answered. Patient expressed understanding and wished to proceed. Sterile technique was used throughout procedure. Epidural site was Dressed with sterile barrier dressing. No paresthesias, signs of intravascular injection Or signs of intrathecal spread were encountered.  Patient was more comfortable after the epidural was dosed. Please see RN's note for documentation of vital signs and FHR which are stable.

## 2013-06-26 LAB — CBC
HEMATOCRIT: 38.1 % (ref 36.0–46.0)
HEMOGLOBIN: 13.1 g/dL (ref 12.0–15.0)
MCH: 31.1 pg (ref 26.0–34.0)
MCHC: 34.4 g/dL (ref 30.0–36.0)
MCV: 90.5 fL (ref 78.0–100.0)
Platelets: 135 10*3/uL — ABNORMAL LOW (ref 150–400)
RBC: 4.21 MIL/uL (ref 3.87–5.11)
RDW: 14.2 % (ref 11.5–15.5)
WBC: 14.5 10*3/uL — AB (ref 4.0–10.5)

## 2013-06-26 LAB — TYPE AND SCREEN
ABO/RH(D): A NEG
ANTIBODY SCREEN: POSITIVE
DAT, IGG: NEGATIVE

## 2013-06-26 MED ORDER — RHO D IMMUNE GLOBULIN 1500 UNIT/2ML IJ SOLN
300.0000 ug | Freq: Once | INTRAMUSCULAR | Status: AC
  Administered 2013-06-26: 300 ug via INTRAMUSCULAR
  Filled 2013-06-26: qty 2

## 2013-06-26 NOTE — Lactation Note (Signed)
This note was copied from the chart of Alicia Ardyth GalJennifer Dobbs. Lactation Consultation Note Follow up visit at 29 hours of age.  Baby has had 7 feedings, 3 voids and 2 stools the past 24 hours.  Mom reports good feedings.  Encouraged mom to call out for a latch score with next feeding, we don't have one recorded for 24 hours.  Encouraged mom to pump to stimulate milk supply due to baby of 5498w2d gestation.  Mom reports needing to take care of herself now and will pump later.  Discussed feeding frequency, swallows and output.  Baby asleep on moms lap.  Mom to call for assist as needed.    Patient Name: Alicia Greene WGNFA'OToday's Date: 06/26/2013 Reason for consult: Follow-up assessment   Maternal Data    Feeding Feeding Type: Breast Fed Length of feed: 30 min  LATCH Score/Interventions                      Lactation Tools Discussed/Used Tools: Pump Breast pump type: Double-Electric Breast Pump   Consult Status Consult Status: Follow-up Date: 06/27/13 Follow-up type: In-patient    Jannifer RodneyShoptaw, Jana Lynn 06/26/2013, 4:43 PM

## 2013-06-26 NOTE — Progress Notes (Signed)
Patient ID: Alicia LovelyJennifer L Greene, female   DOB: [birth year], 38 y.o.   MRN: [id number] Post Partum Day 1 S/P spontaneous vaginal RH status/Rubella reviewed.  Feeding: breast Subjective: No HA, SOB, CP, F/C, breast symptoms. Normal vaginal bleeding, no clots.     Objective: BP 134/74  Pulse 81  Temp(Src) 97.9 F (36.6 C) (Axillary)  Resp 17  Ht 5\' 6"  (1.676 m)  Wt 94.348 kg (208 lb)  BMI 33.59 kg/m2  SpO2 98%  LMP 10/14/2012  Breastfeeding? Unknown I&O reviewed.    Physical Exam:  General: alert Lochia: appropriate Uterine Fundus: firm DVT Evaluation: No evidence of DVT seen on physical exam. Ext: No c/c/e  Recent Labs  06/25/13 1215 06/26/13 0545  HGB 13.8 13.1  HCT 39.9 38.1      Assessment/Plan: 38 y.o.  PPD #1 .  normal postpartum exam Continue current postpartum care Ambulate   LOS: 1 day   JACKSON-MOORE,Alicia Greene 06/26/2013, 9:11 AM

## 2013-06-26 NOTE — Anesthesia Postprocedure Evaluation (Signed)
  Anesthesia Post-op Note  Anesthesia Post Note  Patient: Alicia Greene  Procedure(s) Performed: * No procedures listed *  Anesthesia type: Epidural  Patient location: Mother/Baby  Post pain: Pain level controlled  Post assessment: Post-op Vital signs reviewed  Last Vitals:  Filed Vitals:   06/26/13 0600  BP: 134/74  Pulse: 81  Temp: 36.6 C  Resp: 17    Post vital signs: Reviewed  Level of consciousness:alert  Complications: No apparent anesthesia complications

## 2013-06-27 ENCOUNTER — Encounter: Payer: Self-pay | Admitting: Advanced Practice Midwife

## 2013-06-27 LAB — RH IG WORKUP (INCLUDES ABO/RH)
ABO/RH(D): A NEG
Fetal Screen: NEGATIVE
GESTATIONAL AGE(WKS): 36.2
Unit division: 0

## 2013-06-27 MED ORDER — NORETHINDRONE 0.35 MG PO TABS: 1.0000 | ORAL_TABLET | Freq: Every day | ORAL | Status: DC

## 2013-06-27 NOTE — Progress Notes (Signed)
Ur chart review completed post discharge.  

## 2013-06-27 NOTE — Discharge Summary (Signed)
  Obstetric Discharge Summary Reason for Admission: onset of labor Prenatal Procedures: none Intrapartum Procedures: spontaneous vaginal delivery Postpartum Procedures: none Complications-Operative and Postpartum: none  Hemoglobin  Date Value Ref Range Status  06/26/2013 13.1  12.0 - 15.0 g/dL Final     HCT  Date Value Ref Range Status  06/26/2013 38.1  36.0 - 46.0 % Final    Physical Exam:  General: alert Lochia: appropriate Uterine: firm Incision: n/a DVT Evaluation: No evidence of DVT seen on physical exam.  Discharge Diagnoses: Active Problems:   Labor and delivery, indication for care   Normal delivery   Retained placenta, delivered   Discharge Information: Date: 06/27/2013 Activity: pelvic rest Diet: routine Medications:  Prior to Admission medications   Medication Sig Start Date End Date Taking? Authorizing Provider  Prenatal Vit-Fe Fumarate-FA (PRENATAL MULTIVITAMIN) TABS tablet Take 1 tablet by mouth daily at 12 noon.   Yes Historical Provider, MD  norethindrone (ORTHO MICRONOR) 0.35 MG tablet Take 1 tablet (0.35 mg total) by mouth daily. 06/27/13   Antionette CharLisa Jackson-Moore, MD    Condition: stable Instructions: refer to routine discharge instructions Discharge to: home Follow-up Information   Follow up with Antionette CharJACKSON-MOORE,Lise Pincus A, MD. Schedule an appointment as soon as possible for a visit in 2 weeks.   Specialty:  Obstetrics and Gynecology   Contact information:   [address] [place] [address] [phone]       Newborn Data:  Live born female  Birth Weight: 5 lb 3 oz (2353 g) APGAR: 9, 9   Home with mother.  JACKSON-MOORE,Eusevio Schriver A 06/27/2013, 8:37 AM

## 2013-06-27 NOTE — Progress Notes (Signed)
Mom very upset with the constant interruption.  Unable to get rest.  Requested that infant goes to nursery & allow her to get rest.

## 2013-06-27 NOTE — Lactation Note (Signed)
This note was copied from the chart of Alicia Greene Vandekamp. Lactation Consultation Note: follow up visit with mom. She reports that baby fed a lot through the night. Experienced BF mom ready to go home. Reports that she is not getting any rest here. Reviewed importance of taking good care of herself- rest, good nutrition and plenty of fluids. Mature milk is coming in. Mom easily able to hand express. Reports that she has had thrush with all previous babies- reviewed care and prevention. No further  questions at present. To call prn  Patient Name: Alicia Greene Imm UJWJX'BToday's Date: 06/27/2013 Reason for consult: Follow-up assessment   Maternal Data Does the patient have breastfeeding experience prior to this delivery?: Yes  Feeding Feeding Type: Breast Fed Length of feed: 30 min  LATCH Score/Interventions Latch: Grasps breast easily, tongue down, lips flanged, rhythmical sucking.  Audible Swallowing: Spontaneous and intermittent  Type of Nipple: Everted at rest and after stimulation  Comfort (Breast/Nipple): Soft / non-tender     Hold (Positioning): No assistance needed to correctly position infant at breast. Intervention(s): Breastfeeding basics reviewed;Support Pillows  LATCH Score: 10  Lactation Tools Discussed/Used     Consult Status Consult Status: Complete    Pamelia HoitWeeks, Terrel Nesheiwat D 06/27/2013, 10:19 AM

## 2013-06-27 NOTE — Discharge Instructions (Signed)

## 2013-07-01 ENCOUNTER — Encounter: Payer: Medicaid Other | Admitting: Advanced Practice Midwife

## 2013-07-06 ENCOUNTER — Ambulatory Visit (HOSPITAL_COMMUNITY): Admission: RE | Admit: 2013-07-06 | Payer: Medicaid Other | Source: Ambulatory Visit

## 2013-07-15 ENCOUNTER — Ambulatory Visit (INDEPENDENT_AMBULATORY_CARE_PROVIDER_SITE_OTHER): Payer: Self-pay | Admitting: Advanced Practice Midwife

## 2013-07-15 ENCOUNTER — Encounter: Payer: Self-pay | Admitting: Advanced Practice Midwife

## 2013-07-15 NOTE — Progress Notes (Signed)
Subjective:     Alicia Greene is a 38 y.o. female who presents for a postpartum visit. She is 3 weeks postpartum following a spontaneous vaginal delivery. I have fully reviewed the prenatal and intrapartum course. The delivery was at 36 gestational weeks. Outcome: spontaneous vaginal delivery. Anesthesia: epidural. Postpartum course has been WNL. Baby's course has been WNL. Baby is feeding by breast. Bleeding thin pink/orange watery discharge. Bowel function is normal. Bladder function is normal, Patient states there is a little incontinence there. Patient is not sexually active. Contraception method is abstinence. Postpartum depression screening: positive, Mild Depression.  Patient is very upset about her experience w/ her delivery. She reports that she was unhappy with her care postpartum and is trying to get past it. She states she received conflicting information. States that she was on the verge of a breakdown due to lack of sleep and they would not offer to take the baby w/ out her complaining. States they don't offer pacifiers for soothing. Reports they were in and out of her room frequently to check on the baby, she was unable to rest. Patient discussed care at length w/ me today. She has filed a formal complaint and is disappointed that she continues to remember the bad experience. Reports the care did not make sense and made her doubt herself. Reports having the baby blues but declines medical intervention at this time. States family is helpful.  The following portions of the patient's history were reviewed and updated as appropriate: allergies, current medications, past family history, past medical history, past social history, past surgical history and problem list.  Review of Systems A comprehensive review of systems was negative.   Objective:    BP 126/81  Pulse 67  Temp(Src) 98.3 F (36.8 C)  Ht 5\' 6"  (1.676 m)  Breastfeeding? Yes  General:  alert and cooperative   Breasts:   inspection negative, no nipple discharge or bleeding, no masses or nodularity palpable        Abdomen: soft, non-tender; bowel sounds normal; no masses,  no organomegaly       Assessment:     3 week postpartum exam. Pap smear not done at today's visit.  Normontensive today S/P Retained placenta and manual removal Breastfeeding  Candidate for BTL Condyloma    Plan:    1. Contraception: abstinence 2. Patient plans to schedule consult for BTL 3. Follow up in: 1 week or as needed.  4. Pap at 6 weeks PP 5. Make plan for condyloma treatment. Patient would benefit from cryotherapy.  30 min spent with patient greater than 80% spent in counseling and coordination of care.   Lesley Atkin Wilson SingerWren CNM

## 2013-07-21 ENCOUNTER — Encounter: Payer: Self-pay | Admitting: Obstetrics & Gynecology

## 2013-07-21 ENCOUNTER — Ambulatory Visit (INDEPENDENT_AMBULATORY_CARE_PROVIDER_SITE_OTHER): Payer: Medicaid Other | Admitting: Obstetrics & Gynecology

## 2013-07-21 VITALS — BP 131/85 | HR 77 | Temp 98.1°F | Ht 67.0 in | Wt 198.0 lb

## 2013-07-21 DIAGNOSIS — Z3009 Encounter for other general counseling and advice on contraception: Secondary | ICD-10-CM

## 2013-07-21 NOTE — Progress Notes (Signed)
Subjective:     Alicia Greene is a 38 y.o. female  for a consult visit for Tubal Ligation. Patient denies any concerns.  The HPI was reviewed and explored in further detail by the provider. Gynecologic History No LMP recorded. Contraception: abstinence  Obstetric History OB History  Gravida Para Term Preterm AB SAB TAB Ectopic Multiple Living  5 4 3 1 1 1    4     # Outcome Date GA Lbr Len/2nd Weight Sex Delivery Anes PTL Lv  5 PRE 06/25/13 6447w2d 05:50 / 00:27 5 lb 3 oz (2.353 kg) M SVD EPI  Y  4 TRM 03/01/10 3857w0d  6 lb (2.722 kg) F SVD EPI  Y     Comments: No complications  3 TRM 11/05/08 5162w0d  6 lb (2.722 kg) F SVD EPI  Y     Comments: No complications  2 TRM 08/27/94 8057w0d  7 lb 14 oz (3.572 kg) F SVD None  Y     Comments: No complications  1 SAB 04/07/94 836w0d             Past Medical History  Diagnosis Date  . Bronchitis   . Medical history non-contributory   . HPV (human papilloma virus) anogenital infection     Past Surgical History  Procedure Laterality Date  . No past surgeries     Meds: none current  No Known Allergies  History  Substance Use Topics  . Smoking status: Current Some Day Smoker -- 0.50 packs/day for 15 years    Types: Cigarettes  . Smokeless tobacco: Never Used     Comment: e-cigarette  . Alcohol Use: No    Family History  Problem Relation Age of Onset  . Hypertension Mother   . Hearing loss Mother   . Diabetes Father   . Hearing loss Sister   . Heart disease Maternal Grandmother   . Hearing loss Maternal Grandfather   . Diabetes Paternal Grandmother   . Stroke Paternal Grandmother       Review of Systems  Constitutional: negative for fatigue and weight loss Respiratory: negative for cough and wheezing Cardiovascular: negative for chest pain, fatigue and palpitations Gastrointestinal: negative for abdominal pain and change in bowel habits Musculoskeletal:negative for myalgias Neurological: negative for gait problems and  tremors Behavioral/Psych: negative for abusive relationship, depression Endocrine: negative for temperature intolerance   Genitourinary:negative for abnormal menstrual periods, genital lesions, hot flashes, sexual problems and vaginal discharge Integument/breast: negative for breast lump, breast tenderness, nipple discharge and skin lesion(s)    Objective:        Lab Review Urine pregnancy test Labs reviewed no Radiologic studies reviewed no  50% of 15 min visit spent on counseling and coordination of care.   Assessment:    Considering a sterilization procedure  Plan:    Counseled in detail regarding the sterilization procedures with there attendant benefits/risks.  Pt education materials provided.  Follow up as needed.

## 2013-07-24 NOTE — Patient Instructions (Signed)
Sterilization Information, Female Female sterilization is a procedure to permanently prevent pregnancy. There are different ways to perform sterilization, but all either block or close the fallopian tubes so that your eggs cannot reach your uterus. If your egg cannot reach your uterus, sperm cannot fertilize the egg, and you cannot get pregnant.  Sterilization is performed by a surgical procedure. Sometimes these procedures are performed in a hospital while a patient is asleep. Sometimes they can be done in a clinic setting with the patient awake. The fallopian tubes can be surgically cut, tied, or sealed through a procedure called tubal ligation. The fallopian tubes can also be closed with clips or rings. Sterilization can also be done by placing a tiny coil into each fallopian tube, which causes scar tissue to grow inside the tube. The scar tissue then blocks the tubes.  Discuss sterilization with your caregiver to answer any concerns you or your partner may have. You may want to ask what type of sterilization your caregiver performs. Some caregivers may not perform all the various options. Sterilization is permanent and should only be done if you are sure you do not want children or do not want any more children. Having a sterilization reversed may not be successful.  STERILIZATION PROCEDURES  Laparoscopic sterilization. This is a surgical method performed at a time other than right after childbirth. Two incisions are made in the lower abdomen. A thin, lighted tube (laparoscope) is inserted into one of the incisions and is used to perform the procedure. The fallopian tubes are closed with a ring or a clip. An instrument that uses heat could be used to seal the tubes closed (electrocautery).   Mini-laparotomy. This is a surgical method done 1 or 2 days after giving birth. Typically, a small incision is made just below the belly button (umbilicus) and the fallopian tubes are exposed. The tubes can then be  sealed, tied, or cut.   Hysteroscopic sterilization. This is performed at a time other than right after childbirth. A tiny, spring-like coil is inserted through the cervix and uterus and placed into the fallopian tubes. The coil causes scaring and blocks the tubes. Other forms of contraception should be used for 3 months after the procedure to allow the scar tissue to form completely. Additionally, it is required hysterosalpingography be done 3 months later to ensure that the procedure was successful. Hysterosalpingography is a procedure that uses X-rays to look at your uterus and fallopian tubes after a material to make them show up better has been inserted. IS STERILIZATION SAFE? Sterilization is considered safe with very rare complications. Risks depend on the type of procedure you have. As with any surgical procedure, there are risks. Some risks of sterilization by any means include:   Bleeding.  Infection.  Reaction to anesthesia medicine.  Injury to surrounding organs. Risks specific to having hysteroscopic coils placed include:  The coils may not be placed correctly the first time.   The coils may move out of place.   The tubes may not get completely blocked after 3 months.   Injury to surrounding organs when placing the coil.  HOW EFFECTIVE IS FEMALE STERILIZATION? Sterilization is nearly 100% effective, but it can fail. Depending on the type of sterilization, the rate of failure can be as high as 3%. After hysteroscopic sterilization with placement of fallopian tube coils, you will need back-up birth control for 3 months after the procedure. Sterilization is effective for a lifetime.  BENEFITS OF STERILIZATION  It does   not affect your hormones, and therefore will not affect your menstrual periods, sexual desire, or performance.   It is effective for a lifetime.   It is safe.   You do not need to worry about getting pregnant. Keep in mind that if you had the  hysteroscopic placement procedure, you must wait 3 months after the procedure (or until your caregiver confirms) before pregnancy is not considered possible.   There are no side effects unlike other types of birth control (contraception).  DRAWBACKS OF STERILIZATION  You must be sure you do not want children or any more children. The procedure is permanent.   It does not provide protection against sexually transmitted infections (STIs).   The tubes can grow back together. If this happens, there is a risk of pregnancy. There is also an increased risk (50%) of pregnancy being an ectopic pregnancy. This is a pregnancy that happens outside of the uterus. Document Released: 09/10/2007 Document Revised: 09/23/2011 Document Reviewed: 07/10/2011 [employer] Patient Information 2014 [employer].  

## 2013-08-19 ENCOUNTER — Ambulatory Visit: Payer: Medicaid Other | Admitting: Advanced Practice Midwife

## 2014-02-06 ENCOUNTER — Encounter: Payer: Self-pay | Admitting: Obstetrics & Gynecology

## 2014-04-03 ENCOUNTER — Encounter: Payer: Self-pay | Admitting: *Deleted

## 2014-04-04 ENCOUNTER — Encounter: Payer: Self-pay | Admitting: Obstetrics & Gynecology

## 2014-04-07 HISTORY — PX: TUBAL LIGATION: SHX77

## 2015-04-22 IMAGING — US US OB FOLLOW-UP
1 series · 12 of 28 positions shown · non-contrast
Comparison: none

[Series 1: us ob follow-up · 12 of 33 slices shown]
[im 2/33]
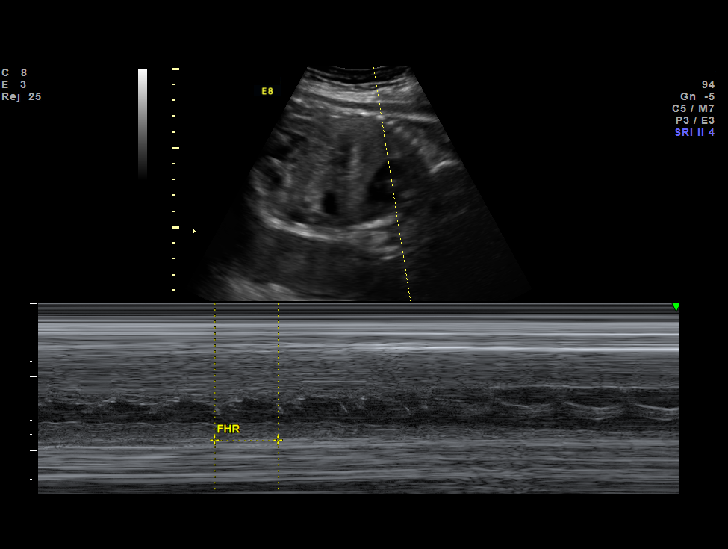
[im 4/33]
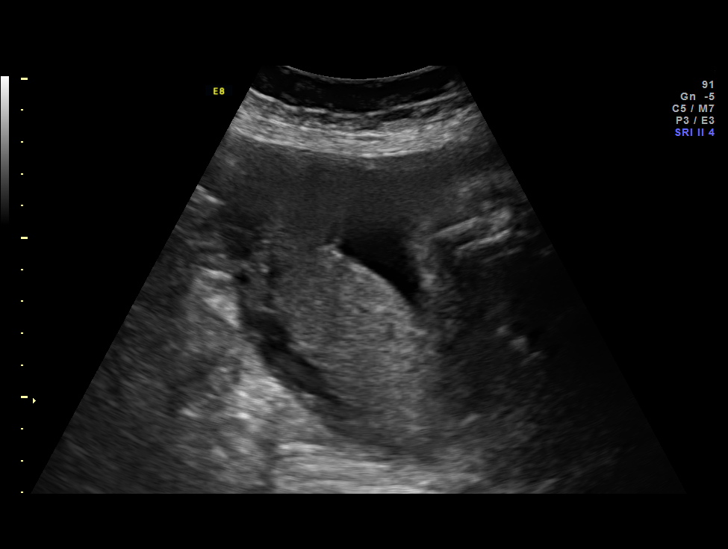
[im 6/33]
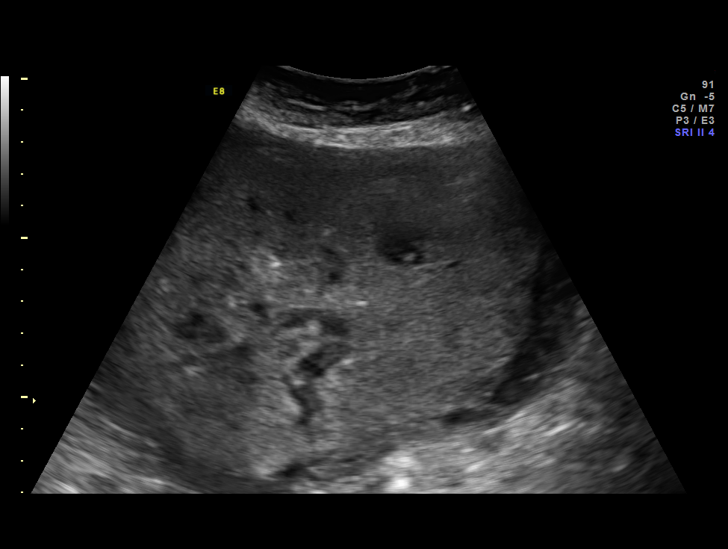
[im 10/33]
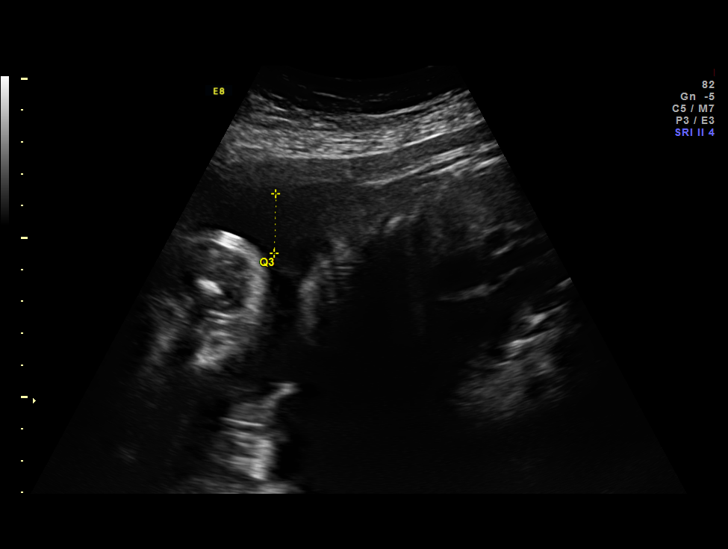
[im 12/33]
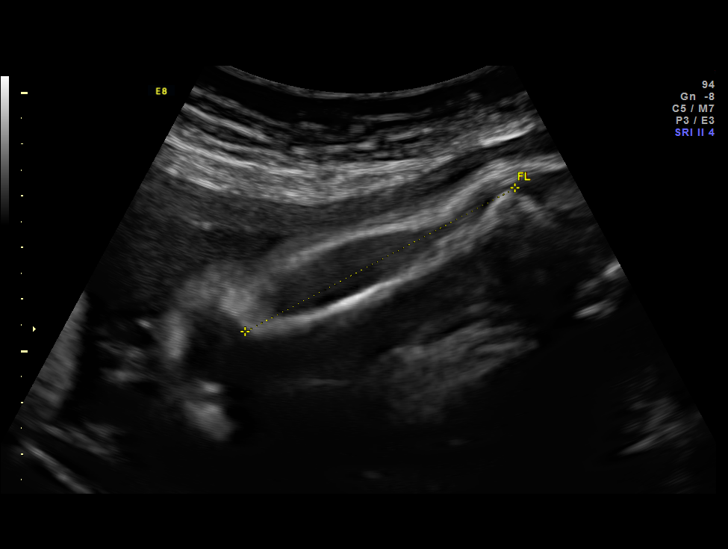
[im 15/33]
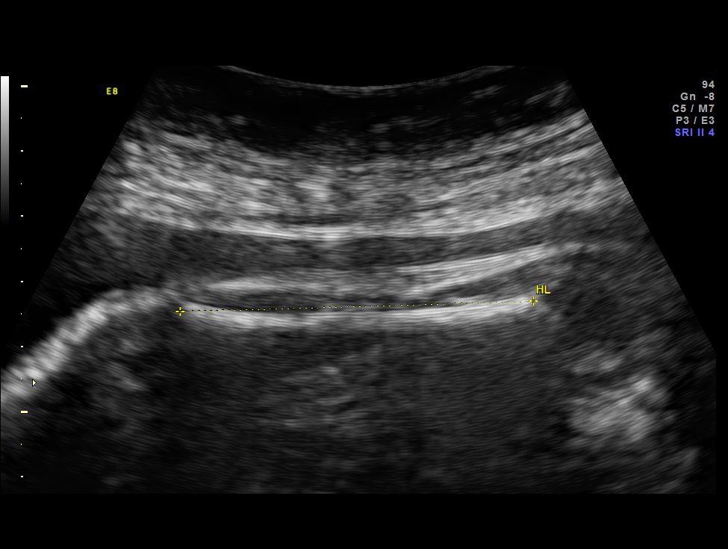
[im 18/33]
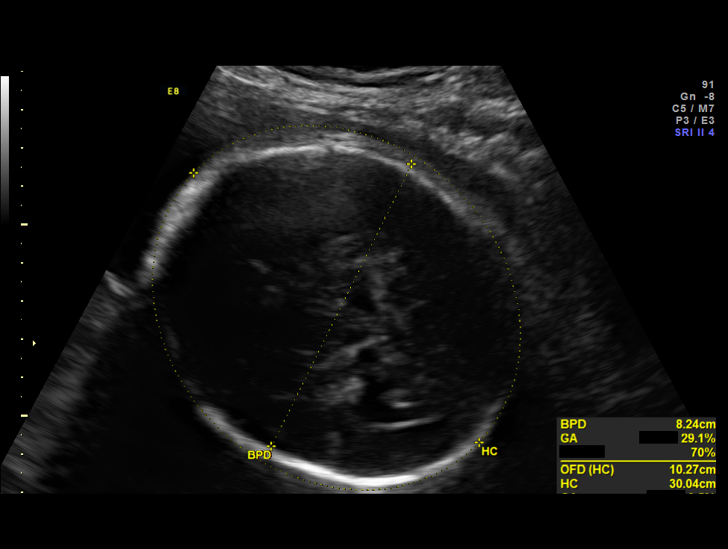
[im 21/33]
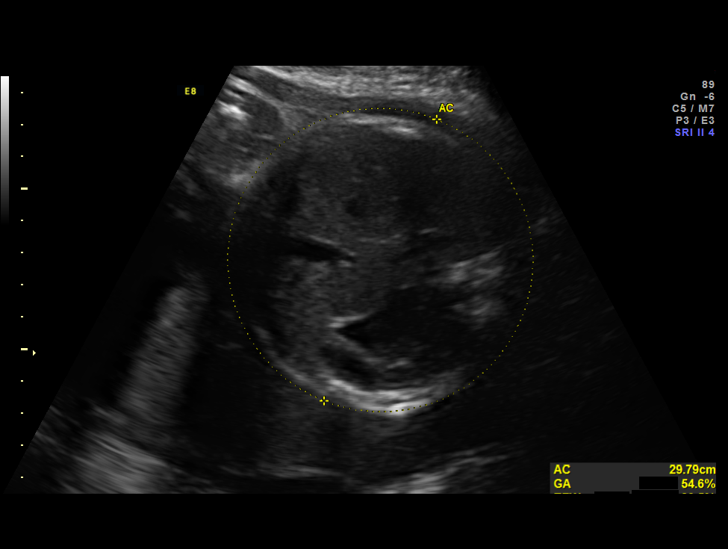
[im 23/33]
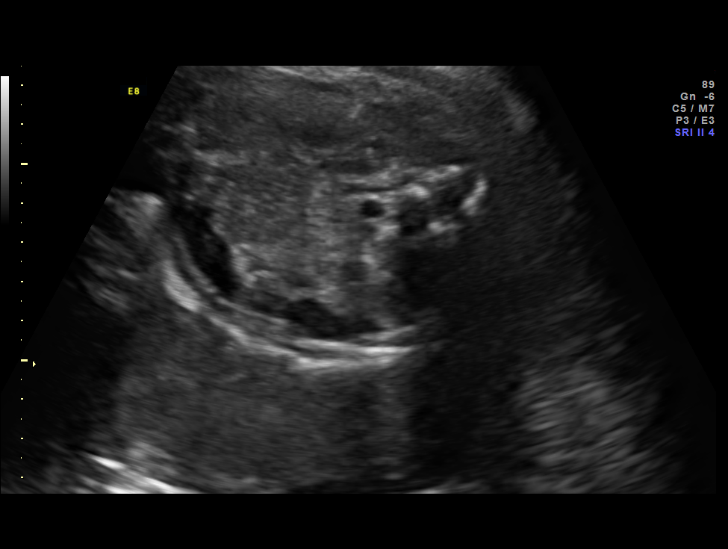
[im 27/33]
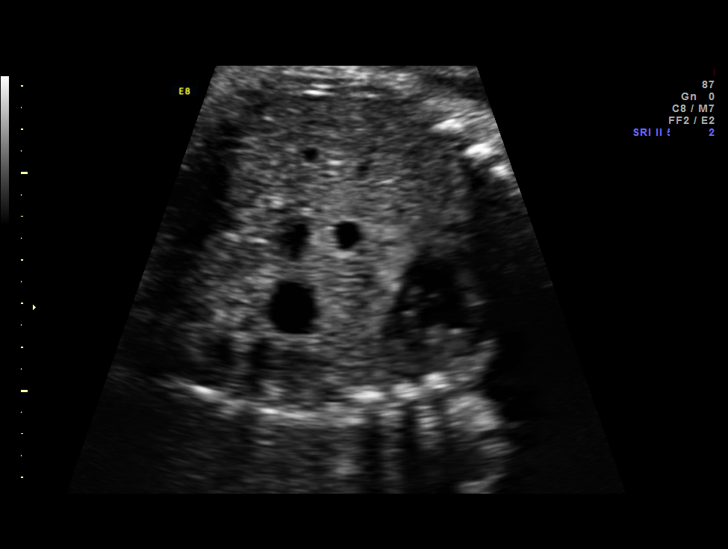
[im 29/33]
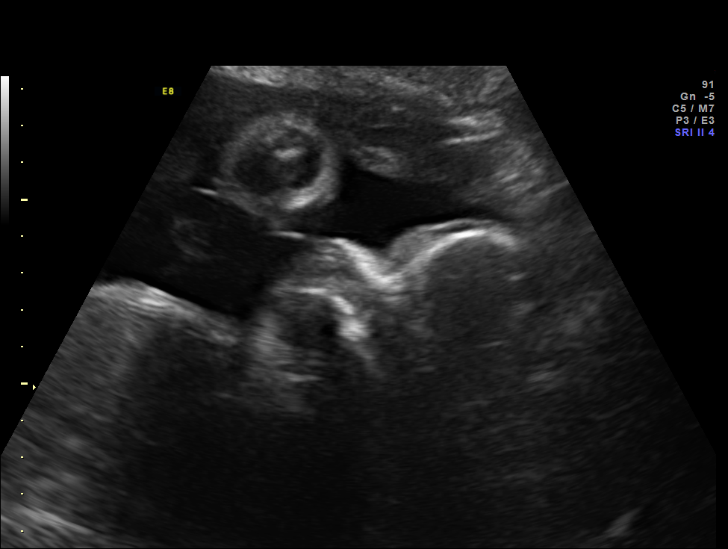
[im 31/33]
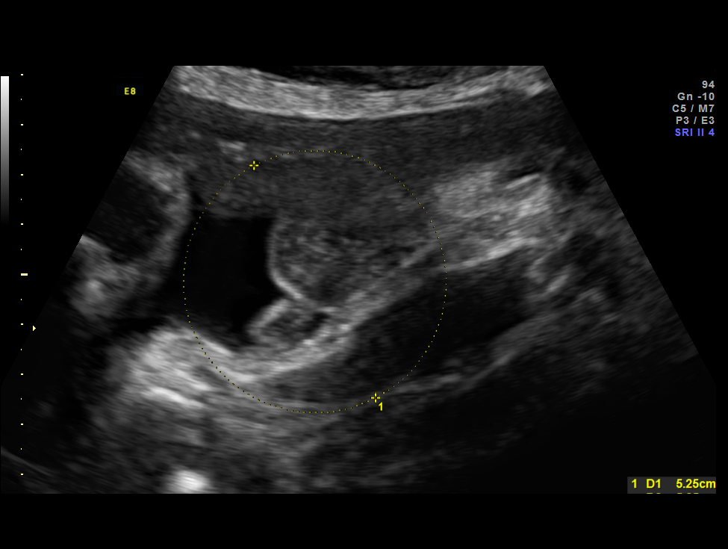

[12 of 28 positions shown; findings below may reference images not displayed]

OBSTETRICS REPORT
                      (Signed Final 06/07/2013 [DATE])

Service(s) Provided

 US OB FOLLOW UP                                       76816.1
Indications

 Advanced maternal age (37), Multigravida - low
 risk NIPS
 Cigarette smoker
 Rh negative
Fetal Evaluation

 Num Of Fetuses:    1
 Fetal Heart Rate:  159                          bpm
 Cardiac Activity:  Observed
 Presentation:      Cephalic
 Placenta:          Posterior, above cervical
                    os
 P. Cord            Previously Visualized
 Insertion:

 Amniotic Fluid
 AFI FV:      Subjectively within normal limits
 AFI Sum:     17.13   cm       62  %Tile     Larg Pckt:    6.65  cm
 RUQ:   6.65    cm   RLQ:    4.66   cm    LUQ:   3.95    cm   LLQ:    1.87   cm
Biometry

 BPD:     82.9  mm     G. Age:  33w 3d                CI:         80.3   70 - 86
 OFD:    103.2  mm                                    FL/HC:      19.5   19.4 -

 HC:     299.8  mm     G. Age:  33w 2d        9  %    HC/AC:      1.02   0.96 -

 AC:       295  mm     G. Age:  33w 4d       46  %    FL/BPD:     70.7   71 - 87
 FL:      58.6  mm     G. Age:  30w 4d      < 3  %    FL/AC:      19.9   20 - 24
 HUM:     54.5  mm     G. Age:  31w 5d       17  %

 Est. FW:    1060  gm      4 lb 7 oz     38  %
Gestational Age

 LMP:           33w 5d        Date:  10/14/12                 EDD:   07/21/13
 U/S Today:     32w 5d                                        EDD:   07/28/13
 Best:          33w 5d     Det. By:  LMP  (10/14/12)          EDD:   07/21/13
Anatomy

 Cranium:          Previously seen        Aortic Arch:      Previously seen
 Fetal Cavum:      Previously seen        Ductal Arch:      Previously seen
 Ventricles:       Appears normal         Diaphragm:        Appears normal
 Choroid Plexus:   Previously seen        Stomach:          Appears normal, left
                                                            sided
 Cerebellum:       Previously seen        Abdomen:          Previously seen
 Posterior Fossa:  Previously seen        Abdominal Wall:   Previously seen
 Nuchal Fold:      Previously seen        Cord Vessels:     Previously seen
 Face:             Orbits and profile     Kidneys:          Appear normal
                   previously seen
 Lips:             Previously seen        Bladder:          Appears normal
 Palate:           Previously seen        Spine:            Previously seen
 Heart:            Appears normal         Lower             Previously seen
                   (4CH, axis, and        Extremities:
                   situs)
 RVOT:             Previously seen        Upper             Previously seen
                                          Extremities:
 LVOT:             Previously seen

 Other:  Male gender. Heels and 5th digit previously seen.
Cervix Uterus Adnexa

 Cervix:       Not visualized (advanced GA >34 wks)
Comments

 Ms. Malvika declined genetic counseling today. Had CG with
 daughter and has no concerns regarding her family history.
 AMA - aneuploidy; low risk cell free DNA screening
 AMA - testing; if growth OK and no other comorbidities,
 testing is not necessary (would test if 40 or > years old,
 starting at 36 weeks); deliver by EDC
Impression

 IUP at 33+5 weeks
 Normal interval anatomy; anatomic survey complete
 Normal amniotic fluid volume
 Appropriate interval growth with EFW at the 38th %tile

Recommendations

 Suggest ultrasound for growth in 4 weeks (due to smoking)

## 2016-05-07 ENCOUNTER — Emergency Department (HOSPITAL_COMMUNITY): Payer: Medicaid Other

## 2016-05-07 ENCOUNTER — Encounter (HOSPITAL_COMMUNITY): Payer: Self-pay

## 2016-05-07 ENCOUNTER — Emergency Department (HOSPITAL_COMMUNITY)
Admission: EM | Admit: 2016-05-07 | Discharge: 2016-05-07 | Disposition: A | Discharge status: Home / Self Care | Payer: Medicaid Other | Attending: Emergency Medicine | Admitting: Emergency Medicine

## 2016-05-07 DIAGNOSIS — A599 Trichomoniasis, unspecified: Secondary | ICD-10-CM | POA: Diagnosis not present

## 2016-05-07 DIAGNOSIS — R319 Hematuria, unspecified: Secondary | ICD-10-CM

## 2016-05-07 DIAGNOSIS — Z202 Contact with and (suspected) exposure to infections with a predominantly sexual mode of transmission: Secondary | ICD-10-CM | POA: Insufficient documentation

## 2016-05-07 DIAGNOSIS — F17210 Nicotine dependence, cigarettes, uncomplicated: Secondary | ICD-10-CM | POA: Insufficient documentation

## 2016-05-07 DIAGNOSIS — J069 Acute upper respiratory infection, unspecified: Secondary | ICD-10-CM | POA: Insufficient documentation

## 2016-05-07 DIAGNOSIS — N898 Other specified noninflammatory disorders of vagina: Secondary | ICD-10-CM | POA: Diagnosis present

## 2016-05-07 DIAGNOSIS — N390 Urinary tract infection, site not specified: Secondary | ICD-10-CM | POA: Diagnosis not present

## 2016-05-07 LAB — URINALYSIS, ROUTINE W REFLEX MICROSCOPIC
Bilirubin Urine: NEGATIVE
Glucose, UA: NEGATIVE mg/dL
Ketones, ur: NEGATIVE mg/dL
NITRITE: NEGATIVE
Protein, ur: NEGATIVE mg/dL
SPECIFIC GRAVITY, URINE: 1.009 (ref 1.005–1.030)
pH: 5 (ref 5.0–8.0)

## 2016-05-07 LAB — WET PREP, GENITAL
Clue Cells Wet Prep HPF POC: NONE SEEN
SPERM: NONE SEEN
Yeast Wet Prep HPF POC: NONE SEEN

## 2016-05-07 MED ORDER — CEPHALEXIN 500 MG PO CAPS: 500.0000 mg | ORAL_CAPSULE | Freq: Three times a day (TID) | ORAL | 0 refills | Status: AC

## 2016-05-07 MED ORDER — AZITHROMYCIN 250 MG PO TABS
1000.0000 mg | ORAL_TABLET | Freq: Once | ORAL | Status: AC
  Administered 2016-05-07: 1000 mg via ORAL
  Filled 2016-05-07: qty 4

## 2016-05-07 MED ORDER — ONDANSETRON 4 MG PO TBDP
4.0000 mg | ORAL_TABLET | Freq: Once | ORAL | Status: AC
  Administered 2016-05-07: 4 mg via ORAL
  Filled 2016-05-07: qty 1

## 2016-05-07 MED ORDER — CEFTRIAXONE SODIUM 250 MG IJ SOLR
250.0000 mg | Freq: Once | INTRAMUSCULAR | Status: AC
  Administered 2016-05-07: 250 mg via INTRAMUSCULAR
  Filled 2016-05-07: qty 250

## 2016-05-07 MED ORDER — METRONIDAZOLE 500 MG PO TABS
2000.0000 mg | ORAL_TABLET | Freq: Once | ORAL | Status: AC
  Administered 2016-05-07: 2000 mg via ORAL
  Filled 2016-05-07: qty 4

## 2016-05-07 MED ORDER — LIDOCAINE HCL (PF) 1 % IJ SOLN
INTRAMUSCULAR | Status: AC
  Administered 2016-05-07: 5 mL
  Filled 2016-05-07: qty 5

## 2016-05-07 NOTE — ED Triage Notes (Signed)
Pt presents for STD check, was treated for trichimonis and BV in November, reports burning, vaginal irritation and discharge.  Pt took abx in December.  Pt also reports productive cough with yellow phlegm, nasal congestion and sore throat.

## 2016-05-07 NOTE — ED Notes (Signed)
Patient Alert and oriented X4. Stable and ambulatory. Patient verbalized understanding of the discharge instructions.  Patient belongings were taken by the patient.  

## 2016-05-07 NOTE — ED Provider Notes (Signed)
[facility]-EMERGENCY DEPT Provider Note   CSN: [id number] Arrival date & time: 05/07/16  1227     History   Chief Complaint Chief Complaint  Patient presents with  . Vaginal Discharge    HPI Alicia Greene is a 41 y.o. female.  HPI here for evaluation of vaginal discharge and itching as well as URI symptoms. Patient reports URI symptoms and ongoing since November. She reports being treated with multiple rounds of antibiotics and is overall doing better, but still is hoarse. Secondarily, she reports mild vaginal discharge and itching intermittently over the past one week. Mild dysuria. She reports feels similar to when she had to be treated for STI in December. She does report monogamous relationship with female partner for 13 years. Denies fevers, chills, abdominal/pelvic pain, back pain, diarrhea or constipation, nausea or vomiting, chest pain or source of breath, rash. She does admit to being an active cigarette smoker.  Past Medical History:  Diagnosis Date  . Bronchitis   . HPV (human papilloma virus) anogenital infection   . Medical history non-contributory     Patient Active Problem List   Diagnosis Date Noted  . HPV (human papilloma virus) anogenital infection 02/25/2013  . 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 Smoker 12/03/2012  . High blood pressure 12/03/2012    Past Surgical History:  Procedure Laterality Date  . NO PAST SURGERIES      OB History    Gravida Para Term Preterm AB Living   5 4 3 1 1 4    SAB TAB Ectopic Multiple Live Births   1       4       Home Medications    Prior to Admission medications   Medication Sig Start Date End Date Taking? Authorizing Provider  cephALEXin (KEFLEX) 500 MG capsule Take 1 capsule (500 mg total) by mouth 3 (three) times daily. 05/07/16 05/14/16  Joycie Peek, PA-C  norethindrone (ORTHO MICRONOR) 0.35 MG tablet Take 1 tablet (0.35 mg total) by mouth daily. Patient not taking: Reported on 05/07/2016 06/27/13    Antionette Char, MD    Family History Family History  Problem Relation Age of Onset  . Hypertension Mother   . Hearing loss Mother   . Diabetes Father   . Hearing loss Sister   . Heart disease Maternal Grandmother   . Hearing loss Maternal Grandfather   . Diabetes Paternal Grandmother   . Stroke Paternal Grandmother     Social History Social History  Substance Use Topics  . Smoking status: Current Some Day Smoker    Packs/day: 0.50    Years: 15.00    Types: Cigarettes  . Smokeless tobacco: Never Used     Comment: e-cigarette  . Alcohol use No     Allergies   Patient has no known allergies.   Review of Systems Review of Systems A 10 point review of systems was completed and was negative except for pertinent positives and negatives as mentioned in the history of present illness    Physical Exam Updated Vital Signs BP 142/85 (BP Location: Left Arm)   Pulse 81   Temp 98.1 F (36.7 C) (Oral)   Resp 19   Ht 5\' 6"  (1.676 m)   Wt 90.7 kg   LMP 04/30/2016 (Approximate) Comment: tubes tied  SpO2 99%   BMI 32.28 kg/m   Physical Exam  Constitutional: She appears well-developed. No distress.  Awake, alert and nontoxic in appearance  HENT:  Head: Normocephalic and atraumatic.  Right Ear: External ear normal.  Left Ear: External ear normal.  Nose: Nose normal.  Mildly erythematous posterior oropharynx. No exudate. Also sounds mildly hoarse  Eyes: Conjunctivae and EOM are normal. Pupils are equal, round, and reactive to light.  Neck: Normal range of motion. No JVD present.  Cardiovascular: Normal rate, regular rhythm and normal heart sounds.   Pulmonary/Chest: Effort normal and breath sounds normal. No stridor.  Abdominal: Soft. There is no tenderness.  Genitourinary:  Genitourinary Comments:  Female Chaperone was present for the entire genital exam. No lesions or rashes appreciated on vulva. Cervix visualized on speculum exam and appropriate cultures  sampled. Scant blood in vaginal vault. Discharge: Cloudy white Upon bi manual exam- No TTP of the adnexa, no cervical motion tenderness. No fullness or masses appreciated. No abnormalities appreciated in structural anatomy.   Musculoskeletal: Normal range of motion.  Neurological:  Awake, alert, cooperative and aware of situation; motor strength bilaterally; sensation normal to light touch bilaterally; no facial asymmetry; tongue midline; major cranial nerves appear intact;  baseline gait without new ataxia.  Skin: No rash noted. She is not diaphoretic.  Psychiatric: She has a normal mood and affect. Her behavior is normal. Thought content normal.  Nursing note and vitals reviewed.    ED Treatments / Results  Labs (all labs ordered are listed, but only abnormal results are displayed) Labs Reviewed  WET PREP, GENITAL - Abnormal; Notable for the following:       Result Value   Trich, Wet Prep PRESENT (*)    WBC, Wet Prep HPF POC MODERATE (*)    All other components within normal limits  URINALYSIS, ROUTINE W REFLEX MICROSCOPIC - Abnormal; Notable for the following:    APPearance HAZY (*)    Hgb urine dipstick SMALL (*)    Leukocytes, UA LARGE (*)    Bacteria, UA RARE (*)    Squamous Epithelial / LPF 6-30 (*)    All other components within normal limits  RPR  HIV ANTIBODY (ROUTINE TESTING)  GC/CHLAMYDIA PROBE AMP ([place]) NOT AT [facility]    EKG  EKG Interpretation None       Radiology Dg Chest 2 View  Result Date: 05/07/2016 CLINICAL DATA:  Somewhat productive cough and congestion for 2 months EXAM: CHEST  2 VIEW COMPARISON:  03/09/2011 FINDINGS: The heart size and mediastinal contours are within normal limits. Both lungs are clear. The visualized skeletal structures are unremarkable. IMPRESSION: No active cardiopulmonary disease. Electronically Signed   By: Alcide Clever M.D.   On: 05/07/2016 13:24    Procedures Procedures (including critical care time)  Medications  Ordered in ED Medications  azithromycin (ZITHROMAX) tablet 1,000 mg (1,000 mg Oral Given 05/07/16 1609)  metroNIDAZOLE (FLAGYL) tablet 2,000 mg (2,000 mg Oral Given 05/07/16 1609)  cefTRIAXone (ROCEPHIN) injection 250 mg (250 mg Intramuscular Given 05/07/16 1609)  lidocaine (PF) (XYLOCAINE) 1 % injection (5 mLs  Given 05/07/16 1609)     Initial Impression / Assessment and Plan / ED Course  I have reviewed the triage vital signs and the nursing notes.  Pertinent labs & imaging results that were available during my care of the patie precautions discussed.nt were reviewed by me and considered in my medical decision making (see chart for details).   Pelvic exam has a cloudy discharge, but I have a low suspicion for PID. Her wet prep is positive for Trichomonas and many white cells. She has elected to be treated empirically. Given metronidazole 2 g, azithromycin 1 g  and ceftriaxone 250 mg. We'll also treat UTI with Keflex. Discussed continue supportive care at home for URI symptoms. Discussed cessation of cigarette smoking as this can contribute to hoarseness. Follow-up with PCP, health department for STI testing. Return precautions discussed  Final Clinical Impressions(s) / ED Diagnoses   Final diagnoses:  Trichimoniasis  STD exposure  Urinary tract infection with hematuria, site unspecified  Viral upper respiratory tract infection    New Prescriptions New Prescriptions   CEPHALEXIN (KEFLEX) 500 MG CAPSULE    Take 1 capsule (500 mg total) by mouth 3 (three) times daily.     Joycie PeekBenjamin Jami Bogdanski, PA-C 05/07/16 1616    Mancel BaleElliott Wentz, MD 05/07/16 [phone]

## 2016-05-07 NOTE — Discharge Instructions (Signed)
Please take your medications as prescribed. Take all of your antibiotics, do not save or share them. You currently has STD cultures pending, we will notify you of the results if positive and recommendations for treatment. Please follow-up with your doctor. Return to ED for new or worsening symptoms as we discussed.

## 2016-05-08 LAB — RPR: RPR Ser Ql: NONREACTIVE

## 2016-05-08 LAB — GC/CHLAMYDIA PROBE AMP (~~LOC~~) NOT AT ARMC
Chlamydia: NEGATIVE
NEISSERIA GONORRHEA: NEGATIVE

## 2016-05-08 LAB — HIV ANTIBODY (ROUTINE TESTING W REFLEX): HIV SCREEN 4TH GENERATION: NONREACTIVE

## 2016-06-16 ENCOUNTER — Ambulatory Visit: Payer: Medicaid Other | Admitting: Obstetrics and Gynecology

## 2016-07-08 ENCOUNTER — Ambulatory Visit (INDEPENDENT_AMBULATORY_CARE_PROVIDER_SITE_OTHER): Payer: Medicaid Other | Admitting: Obstetrics and Gynecology

## 2016-07-08 ENCOUNTER — Other Ambulatory Visit (HOSPITAL_COMMUNITY)
Admission: RE | Admit: 2016-07-08 | Discharge: 2016-07-08 | Disposition: A | Discharge status: Home / Self Care | Payer: Medicaid Other | Source: Ambulatory Visit | Attending: Obstetrics and Gynecology | Admitting: Obstetrics and Gynecology

## 2016-07-08 ENCOUNTER — Encounter: Payer: Self-pay | Admitting: Obstetrics and Gynecology

## 2016-07-08 VITALS — BP 133/89 | HR 87 | Ht 67.0 in | Wt 210.8 lb

## 2016-07-08 DIAGNOSIS — A5901 Trichomonal vulvovaginitis: Secondary | ICD-10-CM | POA: Insufficient documentation

## 2016-07-08 DIAGNOSIS — B9689 Other specified bacterial agents as the cause of diseases classified elsewhere: Secondary | ICD-10-CM | POA: Diagnosis not present

## 2016-07-08 DIAGNOSIS — Z01419 Encounter for gynecological examination (general) (routine) without abnormal findings: Secondary | ICD-10-CM | POA: Diagnosis not present

## 2016-07-08 DIAGNOSIS — N760 Acute vaginitis: Secondary | ICD-10-CM | POA: Insufficient documentation

## 2016-07-08 NOTE — Progress Notes (Signed)
Patient is in the office for annual exam, reports recurrent std infection that has been treated twice with no relief. Patient reports irregular bleeding, itching/irritation.

## 2016-07-08 NOTE — Progress Notes (Signed)
Obstetrics and Gynecology Annual Patient Evaluation  Appointment Date: 07/08/2016  OBGYN Clinic: [facility] for [facility]  Primary Care Provider: None  Chief Complaint:  Chief Complaint  Patient presents with  . New Patient (Initial Visit)    Patient is in the office for annual exam.    History of Present Illness: Alicia Greene is a 41 y.o. Caucasian [id number] (LMP: two weeks ago), seen for the above chief complaint. Her past medical history is significant for h/o BTL, trich and condylomas, BMI 30s, possible HTN, tobacco abuse.   Patient states only issue today is recurrent trich infections. She has been treated twice with one time PO dose and she states her partner has been treated, too. Since initially being diagnosed in late 2017, she has had occasional spotting after intercourse and her periods seem to be coming on q2wk and lighter then [place] like they usually were.  No breast s/s, fevers, chills, chest pain, SOB, nausea, vomiting, abdominal pain, dysuria, hematuria, vaginal itching, dyspareunia, diarrhea, constipation, blood in BMs  Review of Systems: as noted in the History of Present Illness.  Past Medical History:  Past Medical History:  Diagnosis Date  . Bronchitis   . HPV (human papilloma virus) anogenital infection   . Trichimoniasis     Past Surgical History:  Past Surgical History:  Procedure Laterality Date  . TUBAL LIGATION  2016    Past Obstetrical History:  OB History  Gravida Para Term Preterm AB Living  SAB TAB Ectopic Multiple Live Births  1       4    # Outcome Date GA Lbr Len/2nd Weight Sex Delivery Anes PTL Lv  5 Preterm 06/25/13 [redacted]w[redacted]d 05:50 / 00:27 5 lb 3 oz (2.353 kg) M Vag-Spont EPI  LIV  4 Term 03/01/10 [redacted]w[redacted]d  6 lb (2.722 kg) F Vag-Spont EPI  LIV     Birth Comments: No complications  3 Term 11/05/08 [redacted]w[redacted]d  6 lb (2.722 kg) F Vag-Spont EPI  LIV     Birth Comments: No complications  2 Term 08/27/94 [redacted]w[redacted]d  7 lb 14 oz  (3.572 kg) F Vag-Spont None  LIV     Birth Comments: No complications  1 SAB 04/07/94 [redacted]w[redacted]d             Past Gynecological History: As per HPI. Pap negative 2016; no hpv testing done  Social History:  Social History   Social History  . Marital status: Significant Other    Spouse name: N/A  . Number of children: N/A  . Years of education: N/A   Occupational History  . Not on file.   Social History Main Topics  . Smoking status: Current Some Day Smoker    Packs/day: 0.50    Years: 15.00    Types: Cigarettes  . Smokeless tobacco: Never Used     Comment: e-cigarette  . Alcohol use No  . Drug use: No  . Sexual activity: Yes    Birth control/ protection: Surgical   Other Topics Concern  . Not on file   Social History Narrative  . No narrative on file    Family History:  Family History  Problem Relation Age of Onset  . Hypertension Mother   . Hearing loss Mother   . Diabetes Father   . Hearing loss Sister   . Heart disease Maternal Grandmother   . Hearing loss Maternal Grandfather   . Diabetes Paternal Grandmother   . Stroke Paternal Grandmother  She denies any female cancers  Health Maintenance:  Mammogram: no  Medications None  Allergies Patient has no known allergies.   Physical Exam:  BP 133/89   Pulse 87   Ht  (1.702 m)   Wt 210 lb 12.8 oz (95.6 kg)   BMI 33.02 kg/m  Body mass index is 33.02 kg/m. General appearance: Well nourished, well developed female in no acute distress.  Neck:  Supple, normal appearance, and no thyromegaly  Cardiovascular: normal s1 and s2.  No murmurs, rubs or gallops. Respiratory:  Clear to auscultation bilateral. Normal respiratory effort Abdomen: positive bowel sounds and no masses, hernias; diffusely non tender to palpation, non distended Breasts: breasts appear normal, no suspicious masses, no skin or nipple changes or axillary nodes, and normal palpation. Neuro/Psych:  Normal mood and affect.  Skin:  Warm  and dry.  Lymphatic:  No inguinal lymphadenopathy.   Pelvic exam: is not limited by body habitus EGBUS: within normal limits, Vagina: within normal limits and with no blood or discharge in the vault, Cervix: normal appearing cervix without tenderness, discharge or lesions. Uterus:  Mobile, nonenlarged and non tender and Adnexa:  normal adnexa and no mass, fullness, tenderness Rectovaginal: deferred  Laboratory: none  Radiology: none  Assessment: pt with normal GYN exam, depression  Plan:  1. Encounter for gynecological examination Normal exam. Will follow up on swab. Pt would like full STI panel. I told her if her AUB continues after she is negative for trich to call us but it's very common to have a change in your period, especially in length inbetween periods after the age of 48. Breast cancer screening options d/w pt and she declines mammogram.  - Cervicovaginal ancillary only - Cytology - PAP - HIV antibody (with reflex) - Basic metabolic panel - Lipid panel - Urinalysis - Hepatitis B Surface AntiGEN - RPR - Hepatitis C Antibody  2. Depression Patient states it's been difficult with the infidelity of her partner that she has been with x 13 years and communicating with him can be difficult at times. She is receptive to speaking with Hulda Marin - Ambulatory referral to Integrated Behavioral [facility]  RTC PRN  Cornelia Copa MD Attending Center for [facility])

## 2016-07-09 LAB — LIPID PANEL
Chol/HDL Ratio: 4.6 ratio — ABNORMAL HIGH (ref 0.0–4.4)
Cholesterol, Total: 174 mg/dL (ref 100–199)
HDL: 38 mg/dL — AB (ref >39)
LDL Calculated: 109 mg/dL — ABNORMAL HIGH (ref 0–99)
Triglycerides: 133 mg/dL (ref 0–149)
VLDL CHOLESTEROL CAL: 27 mg/dL (ref 5–40)

## 2016-07-09 LAB — URINALYSIS
Bilirubin, UA: NEGATIVE
Glucose, UA: NEGATIVE
KETONES UA: NEGATIVE
LEUKOCYTES UA: NEGATIVE
NITRITE UA: NEGATIVE
Protein, UA: NEGATIVE
RBC, UA: NEGATIVE
Specific Gravity, UA: 1.024 (ref 1.005–1.030)
Urobilinogen, Ur: 0.2 mg/dL (ref 0.2–1.0)
pH, UA: 5 (ref 5.0–7.5)

## 2016-07-09 LAB — HEPATITIS C ANTIBODY

## 2016-07-09 LAB — CERVICOVAGINAL ANCILLARY ONLY
BACTERIAL VAGINITIS: POSITIVE — AB
CANDIDA VAGINITIS: NEGATIVE
CHLAMYDIA, DNA PROBE: NEGATIVE
NEISSERIA GONORRHEA: NEGATIVE
TRICH (WINDOWPATH): POSITIVE — AB

## 2016-07-09 LAB — BASIC METABOLIC PANEL
BUN / CREAT RATIO: 14 (ref 9–23)
BUN: 12 mg/dL (ref 6–24)
CO2: 21 mmol/L (ref 18–29)
Calcium: 9.4 mg/dL (ref 8.7–10.2)
Chloride: 102 mmol/L (ref 96–106)
Creatinine, Ser: 0.85 mg/dL (ref 0.57–1.00)
GFR, EST AFRICAN AMERICAN: 98 mL/min/{1.73_m2} (ref >59)
GFR, EST NON AFRICAN AMERICAN: 85 mL/min/{1.73_m2} (ref >59)
Glucose: 108 mg/dL — ABNORMAL HIGH (ref 65–99)
Potassium: 4 mmol/L (ref 3.5–5.2)
Sodium: 143 mmol/L (ref 134–144)

## 2016-07-09 LAB — HEPATITIS B SURFACE ANTIGEN: Hepatitis B Surface Ag: NEGATIVE

## 2016-07-09 LAB — RPR: RPR: NONREACTIVE

## 2016-07-09 LAB — HIV ANTIBODY (ROUTINE TESTING W REFLEX): HIV Screen 4th Generation wRfx: NONREACTIVE

## 2016-07-15 ENCOUNTER — Institutional Professional Consult (permissible substitution): Payer: Medicaid Other

## 2016-07-15 ENCOUNTER — Telehealth: Payer: Self-pay | Admitting: Obstetrics and Gynecology

## 2016-07-15 ENCOUNTER — Encounter: Payer: Self-pay | Admitting: Obstetrics and Gynecology

## 2016-07-15 ENCOUNTER — Other Ambulatory Visit: Payer: Self-pay | Admitting: Obstetrics and Gynecology

## 2016-07-15 DIAGNOSIS — A599 Trichomoniasis, unspecified: Secondary | ICD-10-CM

## 2016-07-15 HISTORY — DX: Trichomoniasis, unspecified: A59.9

## 2016-07-15 MED ORDER — METRONIDAZOLE 500 MG PO TABS: 500.0000 mg | ORAL_TABLET | Freq: Two times a day (BID) | ORAL | 0 refills | Status: AC

## 2016-07-15 NOTE — Telephone Encounter (Signed)
GYN Telephone Note Patient called at [phone] and went immediately to VM and VM left to have patient call the office. Meds and orders are locked but will try and send in flagyl  po bid x 7days. Result note left for patient, too.  Cornelia Copa MD Attending Center for [employer] (Faculty Practice) 07/15/2016 Time: [phone]

## 2016-07-17 NOTE — Telephone Encounter (Signed)
error 

## 2016-08-28 ENCOUNTER — Encounter: Payer: Self-pay | Admitting: Obstetrics & Gynecology

## 2016-08-28 ENCOUNTER — Ambulatory Visit (INDEPENDENT_AMBULATORY_CARE_PROVIDER_SITE_OTHER): Payer: Medicaid Other

## 2016-08-28 ENCOUNTER — Other Ambulatory Visit (HOSPITAL_COMMUNITY)
Admission: RE | Admit: 2016-08-28 | Discharge: 2016-08-28 | Disposition: A | Discharge status: Home / Self Care | Payer: Medicaid Other | Source: Ambulatory Visit | Attending: Obstetrics & Gynecology | Admitting: Obstetrics & Gynecology

## 2016-08-28 VITALS — Ht 66.0 in

## 2016-08-28 DIAGNOSIS — Z8619 Personal history of other infectious and parasitic diseases: Secondary | ICD-10-CM

## 2016-08-28 NOTE — Progress Notes (Signed)
[facility] Instructions for self swab given to pt.

## 2016-09-02 LAB — CERVICOVAGINAL ANCILLARY ONLY: TRICH (WINDOWPATH): NEGATIVE

## 2016-10-10 ENCOUNTER — Emergency Department (HOSPITAL_COMMUNITY)
Admission: EM | Admit: 2016-10-10 | Discharge: 2016-10-10 | Disposition: A | Discharge status: Home / Self Care | Payer: Medicaid Other | Attending: Emergency Medicine | Admitting: Emergency Medicine

## 2016-10-10 ENCOUNTER — Encounter (HOSPITAL_COMMUNITY): Payer: Self-pay | Admitting: *Deleted

## 2016-10-10 DIAGNOSIS — L0291 Cutaneous abscess, unspecified: Secondary | ICD-10-CM

## 2016-10-10 DIAGNOSIS — F17210 Nicotine dependence, cigarettes, uncomplicated: Secondary | ICD-10-CM | POA: Insufficient documentation

## 2016-10-10 DIAGNOSIS — L0231 Cutaneous abscess of buttock: Secondary | ICD-10-CM | POA: Diagnosis not present

## 2016-10-10 DIAGNOSIS — R222 Localized swelling, mass and lump, trunk: Secondary | ICD-10-CM | POA: Diagnosis present

## 2016-10-10 LAB — CBG MONITORING, ED: GLUCOSE-CAPILLARY: 109 mg/dL — AB (ref 65–99)

## 2016-10-10 MED ORDER — SULFAMETHOXAZOLE-TRIMETHOPRIM 800-160 MG PO TABS
1.0000 | ORAL_TABLET | Freq: Once | ORAL | Status: AC
  Administered 2016-10-10: 1 via ORAL
  Filled 2016-10-10: qty 1

## 2016-10-10 MED ORDER — SULFAMETHOXAZOLE-TRIMETHOPRIM 800-160 MG PO TABS: 2.0000 | ORAL_TABLET | Freq: Two times a day (BID) | ORAL | 0 refills | Status: DC

## 2016-10-10 NOTE — ED Notes (Signed)
Pt states she understands instructions. Home stable with steady gait. 

## 2016-10-10 NOTE — ED Provider Notes (Signed)
[facility]-EMERGENCY DEPT Provider Note   CSN: [id number] Arrival date & time: 10/10/16  1000     History   Chief Complaint Chief Complaint  Patient presents with  . Abscess     HPI   Blood pressure 139/87, pulse 89, temperature 97.8 F (36.6 C), temperature source Oral, resp. rate 17, height 5\' 7"  (1.702 m), weight 90.7 kg (200 lb), last menstrual period 09/29/2016, SpO2 99 %, not currently breastfeeding.  Alicia Greene is a 41 y.o. female complaining of Recurrent painful boils to genital and gluteal area over the course of the last several months. She has been applying rubbing alcohol, hydrogen peroxide and Epsom salt rinses to the area she states that they normally open on her own. She denies any pain with defecation, fever, chills, nausea vomiting. She states she doesn't have a primary care doctor diabetes runs in her family. She denies polyuria and polydipsia.  Past Medical History:  Diagnosis Date  . Bronchitis   . HPV (human papilloma virus) anogenital infection   . Trichimoniasis     Patient Active Problem List   Diagnosis Date Noted  . Trichomoniasis 07/15/2016  . HPV (human papilloma virus) anogenital infection 02/25/2013  . 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 Smoker 12/03/2012  . High blood pressure 12/03/2012    Past Surgical History:  Procedure Laterality Date  . TUBAL LIGATION  2016    OB History    Gravida Para Term Preterm AB Living   5 4 3 1 1 4    SAB TAB Ectopic Multiple Live Births   1       4       Home Medications    Prior to Admission medications   Medication Sig Start Date End Date Taking? Authorizing Provider  norethindrone (ORTHO MICRONOR) 0.35 MG tablet Take 1 tablet (0.35 mg total) by mouth daily. Patient not taking: Reported on 08/28/2016 06/27/13   Antionette CharJackson-Moore, Lisa, MD  sulfamethoxazole-trimethoprim (BACTRIM DS) 800-160 MG tablet Take 2 tablets by mouth 2 (two) times daily. 10/10/16   Stuart Guillen, Joni ReiningNicole, PA-C    Family  History Family History  Problem Relation Age of Onset  . Hypertension Mother   . Hearing loss Mother   . Diabetes Father   . Hearing loss Sister   . Heart disease Maternal Grandmother   . Hearing loss Maternal Grandfather   . Diabetes Paternal Grandmother   . Stroke Paternal Grandmother     Social History Social History  Substance Use Topics  . Smoking status: Current Some Day Smoker    Packs/day: 0.50    Years: 15.00    Types: Cigarettes  . Smokeless tobacco: Never Used     Comment: e-cigarette  . Alcohol use No     Allergies   Patient has no known allergies.   Review of Systems Review of Systems  A complete review of systems was obtained and all systems are negative except as noted in the HPI and PMH.    Physical Exam Updated Vital Signs BP (!) 145/101 (BP Location: Right Arm)   Pulse 79   Temp 98.8 F (37.1 C) (Oral)   Resp 16   Ht 5\' 7"  (1.702 m)   Wt 90.7 kg (200 lb)   LMP 09/29/2016 (Approximate)   SpO2 95%   Breastfeeding? No   BMI 31.32 kg/m   Physical Exam  Constitutional: She is oriented to person, place, and time. She appears well-developed and well-nourished. No distress.  HENT:  Head: Normocephalic and  atraumatic.  Mouth/Throat: Oropharynx is clear and moist.  Eyes: Conjunctivae and EOM are normal. Pupils are equal, round, and reactive to light.  Neck: Normal range of motion.  Cardiovascular: Normal rate, regular rhythm and intact distal pulses.   Pulmonary/Chest: Effort normal and breath sounds normal.  Abdominal: Soft. There is no tenderness.  Musculoskeletal: Normal range of motion.  Neurological: She is alert and oriented to person, place, and time.  Skin: She is not diaphoretic.  Multiple small pustules in the inguinal area where the pubic hair is shaved.  Multiple pustules in the gluteal cleft, none midline. There is a 2 x 3 area of induration on the superior left gluteus with no focal fluctuance.  Psychiatric: She has a normal  mood and affect.  Nursing note and vitals reviewed.    ED Treatments / Results  Labs (all labs ordered are listed, but only abnormal results are displayed) Labs Reviewed  CBG MONITORING, ED - Abnormal; Notable for the following:       Result Value   Glucose-Capillary 109 (*)    All other components within normal limits    EKG  EKG Interpretation None       Radiology No results found.  Procedures Procedures (including critical care time)  Medications Ordered in ED Medications  sulfamethoxazole-trimethoprim (BACTRIM DS,SEPTRA DS) 800-160 MG per tablet 1 tablet (1 tablet Oral Given 10/10/16 1104)     Initial Impression / Assessment and Plan / ED Course  I have reviewed the triage vital signs and the nursing notes.  Pertinent labs & imaging results that were available during my care of the patient were reviewed by me and considered in my medical decision making (see chart for details).     Vitals:   10/10/16 1005 10/10/16 1007 10/10/16 1043 10/10/16 1107  BP: 134/84  139/87 (!) 145/101  Pulse: 86  89 79  Resp: 17  17 16   Temp: 97.8 F (36.6 C)   98.8 F (37.1 C)  TempSrc: Oral   Oral  SpO2: 97%  99% 95%  Weight: 90.7 kg (200 lb) 90.7 kg (200 lb)    Height: 5\' 7"  (1.702 m) 5\' 7"  (1.702 m)      Medications  sulfamethoxazole-trimethoprim (BACTRIM DS,SEPTRA DS) 800-160 MG per tablet 1 tablet (1 tablet Oral Given 10/10/16 1104)    Alicia Greene is 41 y.o. female presenting with Folliculitis and one early indurated abscess. I've offered IND but she's declined. Seems to be a chronic issue for her. I've offered Bactroban and MRSA decolonization procedure and she declined. Patient is given a referral to the wellness center and she states that she has Medicaid. I've advised her that she will need to look on her Medicaid card for a primary care physician. She is retracting invited to return to the ED if the area softens and becomes fluctuant I've advised her not to try to  open the abscess is herself.  Evaluation does not show pathology that would require ongoing emergent intervention or inpatient treatment. Pt is hemodynamically stable and mentating appropriately. Discussed findings and plan with patient/guardian, who agrees with care plan. All questions answered. Return precautions discussed and outpatient follow up given.      Final Clinical Impressions(s) / ED Diagnoses   Final diagnoses:  Abscess    New Prescriptions Discharge Medication List as of 10/10/2016 10:54 AM    START taking these medications   Details  sulfamethoxazole-trimethoprim (BACTRIM DS) 800-160 MG tablet Take 2 tablets by mouth 2 (two) times  daily., Starting Fri 10/10/2016, Print         Aishia Barkey, [place], PA-C 10/10/16 1513    Loren Racer, MD 10/14/16 1714

## 2016-10-10 NOTE — Discharge Instructions (Signed)
Please follow with your primary care doctor in the next 2 days for a check-up. They must obtain records for further management.  ° °Do not hesitate to return to the Emergency Department for any new, worsening or concerning symptoms.  ° °

## 2016-10-10 NOTE — ED Triage Notes (Signed)
Pt here for x3 L buttocks abcesses onset 2-3 days, pt hx of the same, pt denies fever & chills, A&O x4

## 2017-06-17 ENCOUNTER — Other Ambulatory Visit: Payer: Self-pay

## 2017-06-17 ENCOUNTER — Emergency Department (HOSPITAL_COMMUNITY)
Admission: EM | Admit: 2017-06-17 | Discharge: 2017-06-17 | Disposition: A | Discharge status: Home / Self Care | Payer: Medicaid Other | Attending: Emergency Medicine | Admitting: Emergency Medicine

## 2017-06-17 ENCOUNTER — Encounter (HOSPITAL_COMMUNITY): Payer: Self-pay | Admitting: *Deleted

## 2017-06-17 DIAGNOSIS — F17210 Nicotine dependence, cigarettes, uncomplicated: Secondary | ICD-10-CM | POA: Diagnosis not present

## 2017-06-17 DIAGNOSIS — J029 Acute pharyngitis, unspecified: Secondary | ICD-10-CM | POA: Insufficient documentation

## 2017-06-17 LAB — RAPID STREP SCREEN (MED CTR MEBANE ONLY): Streptococcus, Group A Screen (Direct): NEGATIVE

## 2017-06-17 NOTE — ED Triage Notes (Signed)
Pt states sore throat x 2 days.  Still able to eat and drink.

## 2017-06-17 NOTE — ED Provider Notes (Signed)
[facility]   CSN: [id number] Arrival date & time: 06/17/17  1656     History   Chief Complaint Chief Complaint  Patient presents with  . Sore Throat    HPI Alicia Greene is a 42 y.o. female who presents with a sore throat.  Patient states that over the past 2 days she has had a gradually worsening sore throat.  She has 4 children at home and states that she wanted to make sure that it is not strep throat.  She denies fever but has had chills.  She also had some sneezing and a little bit of coughing.  She denies any sinus pain, shortness of breath, wheezing, nausea or vomiting.  She has not taken anything over-the-counter.  She is unsure of any sick contacts.  HPI  Past Medical History:  Diagnosis Date  . Bronchitis   . HPV (human papilloma virus) anogenital infection   . Trichimoniasis     Patient Active Problem List   Diagnosis Date Noted  . Trichomoniasis 07/15/2016  . HPV (human papilloma virus) anogenital infection 02/25/2013  . 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 Smoker 12/03/2012  . High blood pressure 12/03/2012    Past Surgical History:  Procedure Laterality Date  . TUBAL LIGATION  2016    OB History    Gravida Para Term Preterm AB Living   5 4 3 1 1 4    SAB TAB Ectopic Multiple Live Births   1       4       Home Medications    Prior to Admission medications   Medication Sig Start Date End Date Taking? Authorizing Provider  norethindrone (ORTHO MICRONOR) 0.35 MG tablet Take 1 tablet (0.35 mg total) by mouth daily. Patient not taking: Reported on 08/28/2016 06/27/13   Antionette CharJackson-Moore, Lisa, MD  sulfamethoxazole-trimethoprim (BACTRIM DS) 800-160 MG tablet Take 2 tablets by mouth 2 (two) times daily. 10/10/16   P[place], Joni ReiningNicole, PA-C    Family History Family History  Problem Relation Age of Onset  . Hypertension Mother   . Hearing loss Mother   . Diabetes Father   . Hearing loss  Sister   . Heart disease Maternal Grandmother   . Hearing loss Maternal Grandfather   . Diabetes Paternal Grandmother   . Stroke Paternal Grandmother     Social History Social History   Tobacco Use  . Smoking status: Current Some Day Smoker    Packs/day: 0.50    Years: 15.00    Pack years: 7.50    Types: Cigarettes  . Smokeless tobacco: Never Used  . Tobacco comment: e-cigarette  Substance Use Topics  . Alcohol use: No  . Drug use: No     Allergies   Patient has no known allergies.   Review of Systems Review of Systems  Constitutional: Positive for chills. Negative for fever.  HENT: Positive for postnasal drip, rhinorrhea, sneezing and sore throat. Negative for congestion, ear pain and sinus pain.   Respiratory: Positive for cough. Negative for shortness of breath.      Physical Exam Updated Vital Signs BP (!) 138/99 (BP Location: Right Arm)   Pulse 63   Temp 98 F (36.7 C) (Oral)   Resp 12   Ht 5\' 7"  (1.702 m)   Wt 83.5 kg (184 lb)   LMP 06/15/2017   SpO2 100%   BMI 28.82 kg/m   Physical Exam  Constitutional: She is oriented to person,  place, and time. She appears well-developed and well-nourished. No distress.  HENT:  Head: Normocephalic and atraumatic.  Right Ear: Hearing, tympanic membrane, external ear and ear canal normal.  Left Ear: Hearing, tympanic membrane, external ear and ear canal normal.  Nose: Nose normal.  Mouth/Throat: Uvula is midline, oropharynx is clear and moist and mucous membranes are normal. Tonsils are 2+ on the right. Tonsils are 2+ on the left. No tonsillar exudate.  Eyes: Conjunctivae are normal. Pupils are equal, round, and reactive to light. Right eye exhibits no discharge. Left eye exhibits no discharge. No scleral icterus.  Neck: Normal range of motion.  Cardiovascular: Normal rate.  Pulmonary/Chest: Effort normal. No respiratory distress.  Abdominal: She exhibits no distension.  Neurological: She is alert and oriented to  person, place, and time.  Skin: Skin is warm and dry.  Psychiatric: She has a normal mood and affect. Her behavior is normal.  Nursing note and vitals reviewed.    ED Treatments / Results  Labs (all labs ordered are listed, but only abnormal results are displayed) Labs Reviewed  RAPID STREP SCREEN (NOT AT [facility])  CULTURE, GROUP A STREP [facility])    EKG  EKG Interpretation None       Radiology No results found.  Procedures Procedures (including critical care time)  Medications Ordered in ED Medications - No data to display   Initial Impression / Assessment and Plan / ED Course  I have reviewed the triage vital signs and the nursing notes.  Pertinent labs & imaging results that were available during my care of the patient were reviewed by me and considered in my medical decision making (see chart for details).  42 year old female with pharyngitis.  She is mildly hypertensive but otherwise vital signs are normal.  She is overall well-appearing.  Exam is remarkable for bilateral enlarged tonsils without exudates.  Rapid strep is negative.  She is advised to start supportive care and that she would be notified if her culture came back positive.  Final Clinical Impressions(s) / ED Diagnoses   Final diagnoses:  Pharyngitis, unspecified etiology    ED Discharge Orders    None       Bethel Born, PA-C 06/17/17 2015    Mancel Bale, MD 06/17/17 [phone]

## 2017-06-17 NOTE — Discharge Instructions (Signed)
Please take over the counter cough/cold medicines You will be called if your culture grows out strep Return if worsening

## 2017-06-19 LAB — CULTURE, GROUP A STREP (THRC)

## 2017-07-06 ENCOUNTER — Encounter: Payer: Self-pay | Admitting: Family Medicine

## 2017-07-06 ENCOUNTER — Other Ambulatory Visit: Payer: Self-pay

## 2017-07-06 ENCOUNTER — Ambulatory Visit: Payer: Medicaid Other | Admitting: Family Medicine

## 2017-07-06 VITALS — BP 110/70 | HR 78 | Temp 98.6°F | Wt 194.0 lb

## 2017-07-06 DIAGNOSIS — B079 Viral wart, unspecified: Secondary | ICD-10-CM | POA: Diagnosis not present

## 2017-07-06 DIAGNOSIS — Z7689 Persons encountering health services in other specified circumstances: Secondary | ICD-10-CM

## 2017-07-06 HISTORY — DX: Viral wart, unspecified: B07.9

## 2017-07-06 NOTE — Progress Notes (Signed)
Subjective:    Patient ID: Alicia LovelyJennifer L Greene, female    DOB: [birth year], 42 y.o.   MRN: [id number]   CC: establish care with new physician  HPI: Establish care: -Patient had Pap smear in 2016, with history of HPV will schedule Pap. -Patient with no complaints today she does smoke 1 pack/day but is not ready to stop smoking.  She will let me know when she is able to stop smoking.  She has the quit now card. - Patient reports that she does drink an occasional glass of wine but that she does not like alcohol that much. -Patient with no complaints this morning.  She does report that she is sometimes anxious just because she has 4 kids at home and the fact that he was older and she is recently separated but she does not have any current suicidal ideation or homicidal ideation.-Patient here to establish care with new doctor in order to be able to come him anytime that she is sick   Review of Systems  HENT: Negative for congestion.   Respiratory: Negative for shortness of breath.   Cardiovascular: Negative for chest pain.  Psychiatric/Behavioral: Negative for substance abuse.    Patient Active Problem List   Diagnosis Date Noted  . Viral warts 07/06/2017  . Trichomoniasis 07/15/2016  . HPV (human papilloma virus) anogenital infection 02/25/2013  . 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 Smoker 12/03/2012  . High blood pressure 12/03/2012     Family History  Problem Relation Age of Onset  . Hypertension Mother   . Hearing loss Mother   . Alcohol abuse Mother   . Heart disease Mother   . High Cholesterol Mother   . Diabetes Father   . Depression Father   . High Cholesterol Father   . Hypertension Father   . Hearing loss Sister   . Heart disease Maternal Grandmother   . Hearing loss Maternal Grandfather   . Diabetes Paternal Grandmother   . Stroke Paternal Grandmother     Past Medical History:  Diagnosis Date  . Bronchitis   . HPV (human papilloma virus)  anogenital infection   . Trichimoniasis     Social Hx: Smokes 1 ppd and has for about 20 years- no interest in quitting now, counceled. Reports occasional alcohol use (1 glass of wine occasionally), or illicit drug use. Has 5 kids, oldest 7623, youngest 2- about to have first grandchild.   Objective:  BP 110/70   Pulse 78   Temp 98.6 F (37 C) (Oral)   Wt 194 lb (88 kg)   LMP 06/15/2017   SpO2 96%   BMI 30.38 kg/m  Vitals and nursing note reviewed  General: NAD, pleasant Head: Atraumatic Neck: Supple Cardiac: RRR, normal heart sounds, no murmurs Respiratory: CTAB, normal effort Abdomen: soft, nontender, nondistended. Bowel sounds present Extremities: no edema or cyanosis. WWP. MSK: normal gait Skin: warm and dry, no rashes noted Neuro: alert and oriented, no focal deficits Psych: Neatly groomed and appropriately dressed. Maintains good eye contact and is cooperative and attentive. Speech is normal volume and rate. Normal affect.  Assessment & Plan:    Viral warts Patient with cutaneous wart on right third digit.  We will start by trying to use topical salicylic acid.  Patient instructed that she could also have this taken off with cryotherapy.  Patient deferred today in order to try salicylic acid.  Healthcare maintenance: Patient to schedule Pap smear when she is able to, previously showed  ACUS with HPV.  Patient counseled on tobacco use and is not ready to quit at this time.  Patient instructed that she may let me know when she is ready and we can provide her with resources.    [place] Farooq Petrovich, DO Family Medicine Resident, PGY-1

## 2017-07-06 NOTE — Patient Instructions (Addendum)
Thank you for coming to see me today. It was a pleasure meeting you! Today we talked about:   Your wart on your hand. You may use topical salicylic acid which may be bought at any pharmacy. You can apply this and if it does not work we can use cryotherapy at your next appointment.   Please follow-up with me as soon as you are able for you pap smear.  If you have any questions or concerns, please do not hesitate to call the office at [phone].  Take Care,   [place] Chan Sheahan, DO

## 2017-07-06 NOTE — Assessment & Plan Note (Signed)
Patient with cutaneous wart on right third digit.  We will start by trying to use topical salicylic acid.  Patient instructed that she could also have this taken off with cryotherapy.  Patient deferred today in order to try salicylic acid.

## 2017-08-13 ENCOUNTER — Encounter (HOSPITAL_COMMUNITY): Payer: Self-pay | Admitting: Family Medicine

## 2017-08-13 ENCOUNTER — Ambulatory Visit (HOSPITAL_COMMUNITY)
Admission: EM | Admit: 2017-08-13 | Discharge: 2017-08-13 | Disposition: A | Discharge status: Home / Self Care | Payer: Medicaid Other | Attending: Family Medicine | Admitting: Family Medicine

## 2017-08-13 DIAGNOSIS — Z202 Contact with and (suspected) exposure to infections with a predominantly sexual mode of transmission: Secondary | ICD-10-CM | POA: Diagnosis not present

## 2017-08-13 DIAGNOSIS — N898 Other specified noninflammatory disorders of vagina: Secondary | ICD-10-CM | POA: Diagnosis not present

## 2017-08-13 DIAGNOSIS — Z8619 Personal history of other infectious and parasitic diseases: Secondary | ICD-10-CM

## 2017-08-13 MED ORDER — METRONIDAZOLE 500 MG PO TABS
ORAL_TABLET | ORAL | Status: AC
  Filled 2017-08-13: qty 4

## 2017-08-13 MED ORDER — METRONIDAZOLE 500 MG PO TABS
2000.0000 mg | ORAL_TABLET | Freq: Once | ORAL | Status: AC
  Administered 2017-08-13: 2000 mg via ORAL

## 2017-08-13 NOTE — ED Provider Notes (Signed)
[facility]    CSN: [id number] Arrival date & time: 08/13/17  1018     History   Chief Complaint Chief Complaint  Patient presents with  . Exposure to STD    HPI Alicia Greene is a 42 y.o. female.   HPI  Alicia Greene and ex husband keep passing trichomonas back and forth because she comes for treatment and he does not.  I stressed to her the importance of using condoms or cutting him off to protect her self. Currently with mild irritation and itching No fever or abdominal pain Refuses blood work  Past Medical History:  Diagnosis Date  . Bronchitis   . HPV (human papilloma virus) anogenital infection   . Trichimoniasis     Patient Active Problem List   Diagnosis Date Noted  . Viral warts 07/06/2017  . Trichomoniasis 07/15/2016  . HPV (human papilloma virus) anogenital infection 02/25/2013  . 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 Smoker 12/03/2012  . High blood pressure 12/03/2012    Past Surgical History:  Procedure Laterality Date  . FEMUR FRACTURE SURGERY Left    when 42 yr old  . TUBAL LIGATION  2016    OB History    Gravida  5   Para  4   Term  3   Preterm  1   AB  1   Living  4     SAB  1   TAB      Ectopic      Multiple      Live Births  4            Home Medications    Prior to Admission medications   Not on File    Family History Family History  Problem Relation Age of Onset  . Hypertension Mother   . Hearing loss Mother   . Alcohol abuse Mother   . Heart disease Mother   . High Cholesterol Mother   . Diabetes Father   . Depression Father   . High Cholesterol Father   . Hypertension Father   . Hearing loss Sister   . Heart disease Maternal Grandmother   . Hearing loss Maternal Grandfather   . Diabetes Paternal Grandmother   . Stroke Paternal Grandmother     Social History Social History   Tobacco Use  . Smoking status: Current Some Day Smoker    Packs/day: 1.00    Years: 20.00   Pack years: 20.00    Types: Cigarettes  . Smokeless tobacco: Never Used  . Tobacco comment: previously used e-cigarette  Substance Use Topics  . Alcohol use: Yes    Alcohol/week: 0.6 oz    Types: 1 Glasses of wine per week  . Drug use: No     Allergies   Patient has no known allergies.   Review of Systems Review of Systems  Constitutional: Negative for chills and fever.  HENT: Negative for ear pain and sore throat.   Eyes: Negative for pain and visual disturbance.  Respiratory: Negative for cough and shortness of breath.   Cardiovascular: Negative for chest pain and palpitations.  Gastrointestinal: Negative for abdominal pain and vomiting.  Genitourinary: Positive for vaginal discharge. Negative for dysuria and hematuria.  Musculoskeletal: Negative for arthralgias and back pain.  Skin: Negative for color change and rash.  Neurological: Negative for seizures and syncope.  All other systems reviewed and are negative.    Physical Exam Triage Vital Signs ED Triage Vitals  Enc Vitals  Group     BP 08/13/17 1055 (!) 152/86     Pulse Rate 08/13/17 1055 66     Resp 08/13/17 1055 18     Temp 08/13/17 1055 98.3 F (36.8 C)     Temp src --      SpO2 08/13/17 1055 98 %     Weight --      Height --      Head Circumference --      Peak Flow --      Pain Score 08/13/17 1054 0     Pain Loc --      Pain Edu? --      Excl. in GC? --    No data found.  Updated Vital Signs BP (!) 152/86   Pulse 66   Temp 98.3 F (36.8 C)   Resp 18   LMP 08/06/2017   SpO2 98%      Physical Exam  Constitutional: She appears well-developed and well-nourished. No distress.  HENT:  Head: Normocephalic and atraumatic.  Eyes: Conjunctivae are normal.  Neck: Neck supple.  Cardiovascular: Normal rate and regular rhythm.  No murmur heard. Pulmonary/Chest: Effort normal and breath sounds normal. No respiratory distress.  Abdominal: There is no tenderness. There is no guarding.  Neurological:  She is alert.  Skin: Skin is warm and dry.  Psychiatric: She has a normal mood and affect.  Poor judgement  Nursing note and vitals reviewed.    UC Treatments / Results  Labs (all labs ordered are listed, but only abnormal results are displayed) Labs Reviewed - No data to display  EKG None  Radiology No results found.  Procedures Procedures (including critical care time)  Medications Ordered in UC Medications  metroNIDAZOLE (FLAGYL) tablet 2,000 mg (2,000 mg Oral Given 08/13/17 1135)    Initial Impression / Assessment and Plan / UC Course  I have reviewed the triage vital signs and the nursing notes.  Pertinent labs & imaging results that were available during my care of the patient were reviewed by me and considered in my medical decision making (see chart for details).      Final Clinical Impressions(s) / UC Diagnoses   Final diagnoses:  Possible exposure to STD  History of trichomoniasis     Discharge Instructions     Take the antibiotic as directed We will contact you with test results Consider taking a probiotic with lactobacillus for prevention   ED Prescriptions    None     Controlled Substance Prescriptions [place] Controlled Substance Registry consulted? Not Applicable   Eustace Moore, MD 08/13/17 [phone]

## 2017-08-13 NOTE — Discharge Instructions (Addendum)
Take the antibiotic as directed We will contact you with test results Consider taking a probiotic with lactobacillus for prevention

## 2017-08-13 NOTE — ED Notes (Signed)
Urine specimen obtained and in lab 

## 2017-08-13 NOTE — ED Triage Notes (Addendum)
Pt here for STD check. Reports exposed to trich

## 2017-08-22 ENCOUNTER — Encounter (HOSPITAL_COMMUNITY): Payer: Self-pay | Admitting: Emergency Medicine

## 2017-08-22 ENCOUNTER — Ambulatory Visit (HOSPITAL_COMMUNITY)
Admission: EM | Admit: 2017-08-22 | Discharge: 2017-08-22 | Disposition: A | Discharge status: Home / Self Care | Payer: Medicaid Other | Attending: Internal Medicine | Admitting: Internal Medicine

## 2017-08-22 DIAGNOSIS — Z8619 Personal history of other infectious and parasitic diseases: Secondary | ICD-10-CM | POA: Insufficient documentation

## 2017-08-22 DIAGNOSIS — I10 Essential (primary) hypertension: Secondary | ICD-10-CM | POA: Diagnosis not present

## 2017-08-22 DIAGNOSIS — Z811 Family history of alcohol abuse and dependence: Secondary | ICD-10-CM | POA: Diagnosis not present

## 2017-08-22 DIAGNOSIS — F17210 Nicotine dependence, cigarettes, uncomplicated: Secondary | ICD-10-CM | POA: Insufficient documentation

## 2017-08-22 DIAGNOSIS — Z822 Family history of deafness and hearing loss: Secondary | ICD-10-CM | POA: Insufficient documentation

## 2017-08-22 DIAGNOSIS — N898 Other specified noninflammatory disorders of vagina: Secondary | ICD-10-CM | POA: Insufficient documentation

## 2017-08-22 DIAGNOSIS — Z833 Family history of diabetes mellitus: Secondary | ICD-10-CM | POA: Diagnosis not present

## 2017-08-22 DIAGNOSIS — Z8249 Family history of ischemic heart disease and other diseases of the circulatory system: Secondary | ICD-10-CM | POA: Diagnosis not present

## 2017-08-22 DIAGNOSIS — Z818 Family history of other mental and behavioral disorders: Secondary | ICD-10-CM | POA: Diagnosis not present

## 2017-08-22 LAB — POCT URINALYSIS DIP (DEVICE)
Bilirubin Urine: NEGATIVE
Glucose, UA: NEGATIVE mg/dL
Hgb urine dipstick: NEGATIVE
KETONES UR: NEGATIVE mg/dL
Leukocytes, UA: NEGATIVE
Nitrite: NEGATIVE
PH: 5.5 (ref 5.0–8.0)
PROTEIN: NEGATIVE mg/dL
Specific Gravity, Urine: >=1.03 (ref 1.005–1.030)
Urobilinogen, UA: 0.2 mg/dL (ref 0.0–1.0)

## 2017-08-22 MED ORDER — METRONIDAZOLE 500 MG PO TABS: 500.0000 mg | ORAL_TABLET | Freq: Two times a day (BID) | ORAL | 0 refills | Status: AC

## 2017-08-22 NOTE — ED Notes (Signed)
No answer

## 2017-08-22 NOTE — ED Notes (Signed)
Patient was yelling from lobby.  Went to lobby to find patient.  Reports she has been waiting to be seen.  Patient was not in lobby at previous attempts to locate patient

## 2017-08-22 NOTE — ED Triage Notes (Signed)
Pt here for vaginal discharge  

## 2017-08-22 NOTE — Discharge Instructions (Signed)
Please take metronidazole twice daily for 7 days. This will treat BV and Trich  We are testing you for Gonorrhea, Chlamydia, Trichomonas, Yeast and Bacterial Vaginosis. We will call you if anything is positive and let you know if you require any further treatment. Please inform partners of any positive results.   Please return if symptoms not improving with treatment, development of fever, nausea, vomiting, abdominal pain.

## 2017-08-23 NOTE — ED Provider Notes (Signed)
[facility]    CSN: [id number] Arrival date & time: 08/22/17  1944     History   Chief Complaint Chief Complaint  Patient presents with  . Vaginal Discharge    HPI Alicia Greene is a 42 y.o. female presenting today for evaluation of vaginal discharge.  Patient states that she has had vaginal discharge for a few weeks now.  It is associated with itching and irritation.  Denies any pain.  Patient is concerned that her ex keeps giving her trichomonas, but she is also sure if this is BV.  Patient was seen here on 5/9 for similar symptoms and treated with 2 g of metronidazole, patient states that symptoms did not improve with this.  He does not appear that labs were obtained at this visit.  Denies urinary symptoms.  Denies systemic symptoms.  HPI  Past Medical History:  Diagnosis Date  . Bronchitis   . HPV (human papilloma virus) anogenital infection   . Trichimoniasis     Patient Active Problem List   Diagnosis Date Noted  . Viral warts 07/06/2017  . Trichomoniasis 07/15/2016  . HPV (human papilloma virus) anogenital infection 02/25/2013  . 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 Smoker 12/03/2012  . High blood pressure 12/03/2012    Past Surgical History:  Procedure Laterality Date  . FEMUR FRACTURE SURGERY Left    when 42 yr old  . TUBAL LIGATION  2016    OB History    Gravida  5   Para  4   Term  3   Preterm  1   AB  1   Living  4     SAB  1   TAB      Ectopic      Multiple      Live Births  4            Home Medications    Prior to Admission medications   Medication Sig Start Date End Date Taking? Authorizing Provider  metroNIDAZOLE (FLAGYL) 500 MG tablet Take 1 tablet (500 mg total) by mouth 2 (two) times daily for 7 days. 08/22/17 08/29/17  Gracelee Stemmler, Junius Creamer, PA-C    Family History Family History  Problem Relation Age of Onset  . Hypertension Mother   . Hearing loss Mother   . Alcohol abuse Mother   . Heart  disease Mother   . High Cholesterol Mother   . Diabetes Father   . Depression Father   . High Cholesterol Father   . Hypertension Father   . Hearing loss Sister   . Heart disease Maternal Grandmother   . Hearing loss Maternal Grandfather   . Diabetes Paternal Grandmother   . Stroke Paternal Grandmother     Social History Social History   Tobacco Use  . Smoking status: Current Some Day Smoker    Packs/day: 1.00    Years: 20.00    Pack years: 20.00    Types: Cigarettes  . Smokeless tobacco: Never Used  . Tobacco comment: previously used e-cigarette  Substance Use Topics  . Alcohol use: Yes    Alcohol/week: 0.6 oz    Types: 1 Glasses of wine per week  . Drug use: No     Allergies   Patient has no known allergies.   Review of Systems Review of Systems  Constitutional: Negative for fever.  Respiratory: Negative for shortness of breath.   Cardiovascular: Negative for chest pain.  Gastrointestinal: Negative for abdominal pain, diarrhea,  nausea and vomiting.  Genitourinary: Positive for vaginal discharge. Negative for dysuria, flank pain, genital sores, hematuria, menstrual problem, vaginal bleeding and vaginal pain.       Vaginal itching  Musculoskeletal: Negative for back pain.  Skin: Negative for rash.  Neurological: Negative for dizziness, light-headedness and headaches.     Physical Exam Triage Vital Signs ED Triage Vitals [08/22/17 2037]  Enc Vitals Group     BP (!) 148/82     Pulse Rate 78     Resp 18     Temp 98.9 F (37.2 C)     Temp Source Oral     SpO2 96 %     Weight      Height      Head Circumference      Peak Flow      Pain Score 0     Pain Loc      Pain Edu?      Excl. in GC?    No data found.  Updated Vital Signs BP (!) 148/82 (BP Location: Left Arm)   Pulse 78   Temp 98.9 F (37.2 C) (Oral)   Resp 18   LMP 08/06/2017   SpO2 96%   Visual Acuity Right Eye Distance:   Left Eye Distance:   Bilateral Distance:    Right Eye  Near:   Left Eye Near:    Bilateral Near:     Physical Exam  Constitutional: She appears well-developed and well-nourished. No distress.  HENT:  Head: Normocephalic and atraumatic.  Eyes: Conjunctivae are normal.  Neck: Neck supple.  Cardiovascular: Normal rate.  No murmur heard. Pulmonary/Chest: Effort normal. No respiratory distress.  Abdominal: Soft. She exhibits no distension.  Genitourinary:  Genitourinary Comments: deferred  Musculoskeletal: She exhibits no edema.  Neurological: She is alert.  Skin: Skin is warm and dry.  Psychiatric: She has a normal mood and affect.  Nursing note and vitals reviewed.    UC Treatments / Results  Labs (all labs ordered are listed, but only abnormal results are displayed) Labs Reviewed  POCT URINALYSIS DIP (DEVICE)  URINE CYTOLOGY ANCILLARY ONLY    EKG None  Radiology No results found.  Procedures Procedures (including critical care time)  Medications Ordered in UC Medications - No data to display  Initial Impression / Assessment and Plan / UC Course  I have reviewed the triage vital signs and the nursing notes.  Pertinent labs & imaging results that were available during my care of the patient were reviewed by me and considered in my medical decision making (see chart for details).     We will go ahead and treat patient for track/BV with the 7-day course of metronidazole.  UA was unremarkable.  Will send off for urine cytology to check for STDs/yeast/BV.  Will call patient and inform of results and alter treatment as needed.Discussed strict return precautions. Patient verbalized understanding and is agreeable with plan.  Final Clinical Impressions(s) / UC Diagnoses   Final diagnoses:  Vaginal discharge     Discharge Instructions     Please take metronidazole twice daily for 7 days. This will treat BV and Trich  We are testing you for Gonorrhea, Chlamydia, Trichomonas, Yeast and Bacterial Vaginosis. We will call  you if anything is positive and let you know if you require any further treatment. Please inform partners of any positive results.   Please return if symptoms not improving with treatment, development of fever, nausea, vomiting, abdominal pain.     ED  Prescriptions    Medication Sig Dispense Auth. Provider   metroNIDAZOLE (FLAGYL) 500 MG tablet Take 1 tablet (500 mg total) by mouth 2 (two) times daily for 7 days. 14 tablet Javell Blackburn, Zoar C, PA-C     Controlled Substance Prescriptions [place] Controlled Substance Registry consulted? Not Applicable   Lew Dawes, [place] 08/23/17 [phone]

## 2017-08-24 ENCOUNTER — Telehealth (HOSPITAL_COMMUNITY): Payer: Self-pay

## 2017-08-24 LAB — URINE CYTOLOGY ANCILLARY ONLY
Chlamydia: NEGATIVE
NEISSERIA GONORRHEA: NEGATIVE
TRICH (WINDOWPATH): NEGATIVE

## 2017-08-24 NOTE — Telephone Encounter (Signed)
Std screening is negative. Attempted to reach patient. No answer at this time.

## 2017-08-25 LAB — URINE CYTOLOGY ANCILLARY ONLY
BACTERIAL VAGINITIS: POSITIVE — AB
CANDIDA VAGINITIS: NEGATIVE

## 2017-11-12 ENCOUNTER — Encounter: Payer: Self-pay | Admitting: Family Medicine

## 2017-11-12 ENCOUNTER — Other Ambulatory Visit (HOSPITAL_COMMUNITY)
Admission: RE | Admit: 2017-11-12 | Discharge: 2017-11-12 | Disposition: A | Discharge status: Home / Self Care | Payer: Medicaid Other | Source: Ambulatory Visit | Attending: Family Medicine | Admitting: Family Medicine

## 2017-11-12 ENCOUNTER — Other Ambulatory Visit: Payer: Self-pay

## 2017-11-12 ENCOUNTER — Ambulatory Visit: Payer: Medicaid Other | Admitting: Family Medicine

## 2017-11-12 VITALS — BP 140/78 | HR 84 | Temp 98.9°F | Ht 67.0 in | Wt 187.4 lb

## 2017-11-12 DIAGNOSIS — Z124 Encounter for screening for malignant neoplasm of cervix: Secondary | ICD-10-CM | POA: Insufficient documentation

## 2017-11-12 DIAGNOSIS — N939 Abnormal uterine and vaginal bleeding, unspecified: Secondary | ICD-10-CM | POA: Diagnosis not present

## 2017-11-12 DIAGNOSIS — Z113 Encounter for screening for infections with a predominantly sexual mode of transmission: Secondary | ICD-10-CM | POA: Diagnosis not present

## 2017-11-12 DIAGNOSIS — R4586 Emotional lability: Secondary | ICD-10-CM | POA: Diagnosis not present

## 2017-11-12 DIAGNOSIS — Z01419 Encounter for gynecological examination (general) (routine) without abnormal findings: Secondary | ICD-10-CM | POA: Diagnosis not present

## 2017-11-12 LAB — POCT WET PREP (WET MOUNT)
CLUE CELLS WET PREP WHIFF POC: NEGATIVE
TRICHOMONAS WET PREP HPF POC: ABSENT

## 2017-11-12 NOTE — Assessment & Plan Note (Signed)
Pap smear with HPV cotesting performed today. °

## 2017-11-12 NOTE — Assessment & Plan Note (Addendum)
Although patient's PHQ 9 was a low score at 5, she marked that she sometimes felt better off dead and had thoughts of hurting herself.  This was not seen on her PHQ9 until patient had left the building, so she was called to make sure that she does not currently have these thoughts.  She says she has not had SI in a while and is not concerned about it currently.  Patient is under a large amount of stress and was tearful during the exam, so her mood warrants further assessment.  Patient was encouraged to meet with behavioral health or to meet with me in the next month or so to discuss her mood and to provide an outlet for her to talk about her stressors.

## 2017-11-12 NOTE — Patient Instructions (Signed)
It was nice meeting you today Ms. Vales!  Today, we performed a pap smear and STI testing.  We also checked for a yeast infection and bacterial vaginosis.  We are getting labs today to see if you have started the process of menopause.  It would be beneficial for you to return to the clinic whenever you would like to make an appointment to discuss your stressors and mood.  We have a behavioral health clinic with trained therapists, and I would be happy to meet with you as well.  If you have any questions or concerns, please feel free to call the clinic.   Be well,  Dr. Frances FurbishWinfrey

## 2017-11-12 NOTE — Progress Notes (Addendum)
Subjective:    Alicia Greene - 42 y.o. female MRN [id number]  Date of birth: 1975/05/30  HPI  Alicia Greene is here for abnormal uterine bleeding and to discuss her stress.    Abnormal uterine bleeding She says that she had intercourse on July 27 and developed spotting on the 29th which occurred again on August 2 and again on August 7.  Some of her spotting was heavier, with "clots."  She would like to know whether she is starting to pause because she normally has periods from that last from 5 to 7 days, but lately she has had periods that are about 9 days in length, and now she has irregular bleeding.  She is sexually active with a new female partner, but she says the last time they had sex without condoms was a few months ago.  She has contraception in the form of a tubal ligation.  She would like STI testing today as well as to seem to see if she is starting menopause.  High stress level She is under a large amount of stress because her husband left her one year ago, and she was homeless with 4 children.  She has been working multiple jobs since then to provide a home for her family and they now live in an apartment.  She has felt very tearful and emotional lately, and she wonders whether this is due to a hormonal change or depression.  She would be interested in therapy.   Health Maintenance:  Health Maintenance Due  Topic Date Due  . TETANUS/TDAP  06/21/1994  . PAP SMEAR  12/04/2015  . INFLUENZA VACCINE  11/05/2017    -  reports that she has been smoking cigarettes. She has a 20.00 pack-year smoking history. She has never used smokeless tobacco. - Review of Systems: Per HPI. - Past Medical History: Patient Active Problem List   Diagnosis Date Noted  . Abnormal uterine bleeding 11/12/2017  . Screening for malignant neoplasm of cervix 11/12/2017  . Screen for STD (sexually transmitted disease) 11/12/2017  . Mood changes 11/12/2017  . Viral warts 07/06/2017  .  Trichomoniasis 07/15/2016  . HPV (human papilloma virus) anogenital infection 02/25/2013  . 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 Smoker 12/03/2012  . High blood pressure 12/03/2012   - Medications: reviewed and updated   Objective:   Physical Exam BP 140/78   Pulse 84   Temp 98.9 F (37.2 C) (Oral)   Ht 5\' 7"  (1.702 m)   Wt 187 lb 6.4 oz (85 kg)   LMP 11/01/2017 (Approximate)   SpO2 96%   BMI 29.35 kg/m  Gen: NAD, alert, cooperative with exam, well-appearing GU: Vulva, vagina, and cervix appear normal.  Normal-appearing vaginal discharge.  Scant bleeding after pap performed. Psych: tearful and emotional  Depression screen [facility] 2/9 11/12/2017 07/06/2017  Decreased Interest 1 0  Down, Depressed, Hopeless 1 0  PHQ - 2 Score 2 0  Altered sleeping 0 -  Tired, decreased energy 0 -  Change in appetite 0 -  Feeling bad or failure about yourself  1 -  Trouble concentrating 0 -  Moving slowly or fidgety/restless 1 -  Suicidal thoughts 1 -  PHQ-9 Score 5 -  Difficult doing work/chores Somewhat difficult -   GAD 7 : Generalized Anxiety Score 11/12/2017  Nervous, Anxious, on Edge 0  Control/stop worrying 0  Worry too much - different things 1  Trouble relaxing 0  Restless 0  Easily  annoyed or irritable 1  Afraid - awful might happen 0  Total GAD 7 Score 2  Anxiety Difficulty Somewhat difficult       Assessment & Plan:   Abnormal uterine bleeding Differential is broad and includes recurrent STI infection, cervical lesions, hormonal changes, vaginal lesions, dysfunctional endometrium, and fibroids.  Will check LH and FSH levels today due to patient's concerns that she may be starting menopause.  Will also check for STIs.  GU exam was reassuring.  Screening for malignant neoplasm of cervix Pap smear with HPV cotesting performed today.  Mood changes Although patient's PHQ 9 was a low score at 5, she marked that she sometimes felt better off dead and had thoughts of  hurting herself.  This was not seen on her PHQ9 until patient had left the building, so she was called to make sure that she does not currently have these thoughts.  She says she has not had SI in a while and is not concerned about it currently.  Patient is under a large amount of stress and was tearful during the exam, so her mood warrants further assessment.  Patient was encouraged to meet with behavioral health or to meet with me in the next month or so to discuss her mood and to provide an outlet for her to talk about her stressors.    Lezlie Octave, M.D. 11/12/2017, 5:30 PM PGY-2, [facility]

## 2017-11-12 NOTE — Assessment & Plan Note (Signed)
Differential is broad and includes recurrent STI infection, cervical lesions, hormonal changes, vaginal lesions, dysfunctional endometrium, and fibroids.  Will check LH and FSH levels today due to patient's concerns that she may be starting menopause.  Will also check for STIs.  GU exam was reassuring.

## 2017-11-13 LAB — FSH/LH
FSH: 2.9 m[IU]/mL
LH: 4.3 m[IU]/mL

## 2017-11-13 LAB — RPR: RPR Ser Ql: NONREACTIVE

## 2017-11-13 LAB — HIV ANTIBODY (ROUTINE TESTING W REFLEX): HIV Screen 4th Generation wRfx: NONREACTIVE

## 2017-11-16 ENCOUNTER — Telehealth: Payer: Self-pay | Admitting: *Deleted

## 2017-11-16 LAB — CERVICOVAGINAL ANCILLARY ONLY
Chlamydia: NEGATIVE
NEISSERIA GONORRHEA: NEGATIVE

## 2017-11-16 NOTE — Telephone Encounter (Signed)
Pt states that her bleeding started back up yesterday.  She was going thru a pad every 1 -2  Hours with cramping.  It did slow down a little but picked back up last night.  She would prefer not to come back in because she has not received the results from her appt on Thursday.  Pt denies SOB, CP or lightheadedness at this time.  She would like for Dr. Frances FurbishWinfrey to call her back about her results and the bleeding. Fleeger, Maryjo RochesterJessica Dawn, CMA

## 2017-11-17 ENCOUNTER — Encounter: Payer: Self-pay | Admitting: Family Medicine

## 2017-11-17 NOTE — Telephone Encounter (Signed)
Discussed patient's results from her last office visit and reassured her that they were normal.  We also discussed her recent menstrual bleeding, which has been heavier than her normal periods.  However, her most recent period was very light, so this could be in response to that.  She has been bleeding since Saturday, August 10, and her bleeding has slowed down somewhat by today.  She denies feeling light headed or weak.  We discussed that the next steps would be to check a CBC, perform a pelvic exam to feel for any adnexal irregularities or large uterus, and do an ultrasound to visualize her uterine lining.  Even though she has had a tubal ligation, we could also perform a pregnancy test, although the likelihood of a pregnancy is very low.  Because this problem has only been going on for 1-2 months, we agreed that she would watch her bleeding and call us on Monday, August 19 if her bleeding had not decreased.

## 2017-11-17 NOTE — Telephone Encounter (Signed)
Patient calling back for results and to speak to doctor about bleeding.   Call back is [phone]  Ples SpecterAlisa Brake, RN [facility])

## 2017-11-18 LAB — CYTOLOGY - PAP
DIAGNOSIS: NEGATIVE
DIAGNOSIS: REACTIVE
HPV: NOT DETECTED

## 2017-12-24 DIAGNOSIS — H5213 Myopia, bilateral: Secondary | ICD-10-CM | POA: Diagnosis not present

## 2017-12-24 DIAGNOSIS — H52223 Regular astigmatism, bilateral: Secondary | ICD-10-CM | POA: Diagnosis not present

## 2017-12-24 DIAGNOSIS — H524 Presbyopia: Secondary | ICD-10-CM | POA: Diagnosis not present

## 2017-12-25 DIAGNOSIS — H5213 Myopia, bilateral: Secondary | ICD-10-CM | POA: Diagnosis not present

## 2018-01-20 DIAGNOSIS — H5213 Myopia, bilateral: Secondary | ICD-10-CM | POA: Diagnosis not present

## 2018-01-20 DIAGNOSIS — H52223 Regular astigmatism, bilateral: Secondary | ICD-10-CM | POA: Diagnosis not present

## 2018-04-26 ENCOUNTER — Encounter: Payer: Self-pay | Admitting: Family Medicine

## 2018-04-26 ENCOUNTER — Other Ambulatory Visit: Payer: Self-pay

## 2018-04-26 ENCOUNTER — Ambulatory Visit: Payer: Medicaid Other | Admitting: Family Medicine

## 2018-04-26 VITALS — BP 122/78 | HR 65 | Temp 98.5°F | Wt 188.4 lb

## 2018-04-26 DIAGNOSIS — J069 Acute upper respiratory infection, unspecified: Secondary | ICD-10-CM | POA: Diagnosis not present

## 2018-04-26 NOTE — Patient Instructions (Signed)
Good to see you today!  Thanks for coming in.  Try the nasal saline and the Sinus rinse.  If not better or worse call and leave a message and I will send in Amoxicillin for sinusitis  Stopping smoking would be the best for your health 1-800 QUIT NOW or come back and talk with your regular doctor

## 2018-04-26 NOTE — Progress Notes (Signed)
Subjective  Alicia Greene is a 43 y.o. female is presenting with the following  URI  Major symptoms: congestion face pain Has been sick for several weeks Progression: staying about the same for a week Medications tried: nasal saline, cold meds Sick contacts: yes family Patient believes may be caused by sinus congestion  Symptoms Fever: no Headache or face pain: yes mostly bilateral Tooth pain: not specifically Sneezing: yes Scratchy throat: no Allergies: no Muscle aches: mild Severe fatigue: moderate Stiff neck: no Shortness of breath: no Rash: no Sore throat or swollen glands: no  SMOKING Smokes 1 ppd was able to stop for 3 years in past    ROS see HPI Smoking Status noted   Chief Complaint noted Review of Symptoms - see HPI PMH - Smoking status noted.    Objective Vital Signs reviewed BP 122/78   Pulse 65   Temp 98.5 F (36.9 C) (Oral)   Wt 188 lb 6.4 oz (85.5 kg)   SpO2 96%   BMI 29.51 kg/m  Alert NAD Lungs:  Normal respiratory effort, chest expands symmetrically. Lungs are clear to auscultation, no crackles or wheezes. Throat: normal mucosa, no exudate, uvula midline, no redness Neck:  No deformities, thyromegaly, masses, or tenderness noted.   Supple with full range of motion without pain. Sinus - mild bilateral tenderness Nose - clear congestion  Assessments/Plans  Prolonged Viral UR See after visit summary for details of patient instuctions Given nasal saline irrigator to try first Discussed smoking cessation

## 2018-05-04 ENCOUNTER — Emergency Department (HOSPITAL_COMMUNITY)
Admission: EM | Admit: 2018-05-04 | Discharge: 2018-05-04 | Disposition: A | Discharge status: Home / Self Care | Payer: Medicaid Other | Attending: Emergency Medicine | Admitting: Emergency Medicine

## 2018-05-04 DIAGNOSIS — K0889 Other specified disorders of teeth and supporting structures: Secondary | ICD-10-CM | POA: Diagnosis not present

## 2018-05-04 DIAGNOSIS — K029 Dental caries, unspecified: Secondary | ICD-10-CM | POA: Insufficient documentation

## 2018-05-04 DIAGNOSIS — F17210 Nicotine dependence, cigarettes, uncomplicated: Secondary | ICD-10-CM | POA: Diagnosis not present

## 2018-05-04 MED ORDER — AMOXICILLIN-POT CLAVULANATE 875-125 MG PO TABS: 1.0000 | ORAL_TABLET | Freq: Two times a day (BID) | ORAL | 0 refills | Status: DC

## 2018-05-04 MED ORDER — NAPROXEN 500 MG PO TABS: 500.0000 mg | ORAL_TABLET | Freq: Two times a day (BID) | ORAL | 0 refills | Status: DC

## 2018-05-04 NOTE — ED Provider Notes (Signed)
[facility] Provider Note   CSN: [id number] Arrival date & time: 05/04/18  1015     History   Chief Complaint No chief complaint on file.   HPI Alicia Greene is a 43 y.o. female presents to ED for evaluation of R lower dental pain for the past 3 days.  States that she has had intermittent symptoms over the past several years with this tooth and other teeth due to dental decay and smoking.  She last saw dentist 4 years ago.  No improvement with Tylenol.  Denies any facial swelling, drainage, abscess, fevers, trouble breathing or trouble swallowing, neck pain.  HPI  Past Medical History:  Diagnosis Date  . Bronchitis   . HPV (human papilloma virus) anogenital infection   . Trichimoniasis     Patient Active Problem List   Diagnosis Date Noted  . Abnormal uterine bleeding 11/12/2017  . Screening for malignant neoplasm of cervix 11/12/2017  . Screen for STD (sexually transmitted disease) 11/12/2017  . Mood changes 11/12/2017  . Viral warts 07/06/2017  . Trichomoniasis 07/15/2016  . HPV (human papilloma virus) anogenital infection 02/25/2013  . 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 Smoker 12/03/2012  . High blood pressure 12/03/2012    Past Surgical History:  Procedure Laterality Date  . FEMUR FRACTURE SURGERY Left    when 43 yr old  . TUBAL LIGATION  2016     OB History    Gravida  5   Para  4   Term  3   Preterm  1   AB  1   Living  4     SAB  1   TAB      Ectopic      Multiple      Live Births  4            Home Medications    Prior to Admission medications   Medication Sig Start Date End Date Taking? Authorizing Provider  amoxicillin-clavulanate (AUGMENTIN) 875-125 MG tablet Take 1 tablet by mouth every 12 (twelve) hours. 05/04/18   Thora Scherman, PA-C  naproxen (NAPROSYN) 500 MG tablet Take 1 tablet (500 mg total) by mouth 2 (two) times daily. 05/04/18   Dietrich PatesKhatri, Haim Hansson, PA-C    Family  History Family History  Problem Relation Age of Onset  . Hypertension Mother   . Hearing loss Mother   . Alcohol abuse Mother   . Heart disease Mother   . High Cholesterol Mother   . Diabetes Father   . Depression Father   . High Cholesterol Father   . Hypertension Father   . Hearing loss Sister   . Heart disease Maternal Grandmother   . Hearing loss Maternal Grandfather   . Diabetes Paternal Grandmother   . Stroke Paternal Grandmother     Social History Social History   Tobacco Use  . Smoking status: Current Some Day Smoker    Packs/day: 1.00    Years: 20.00    Pack years: 20.00    Types: Cigarettes  . Smokeless tobacco: Never Used  . Tobacco comment: previously used e-cigarette  Substance Use Topics  . Alcohol use: Yes    Alcohol/week: 1.0 standard drinks    Types: 1 Glasses of wine per week  . Drug use: No     Allergies   Patient has no known allergies.   Review of Systems Review of Systems  Constitutional: Negative for chills and fever.  HENT: Positive for  dental problem. Negative for ear pain, facial swelling and sore throat.   Gastrointestinal: Negative for vomiting.     Physical Exam Updated Vital Signs BP 133/86   Temp 98.3 F (36.8 C)   Resp 18   SpO2 97%   Physical Exam Vitals signs and nursing note reviewed.  Constitutional:      General: She is not in acute distress.    Appearance: She is well-developed. She is not diaphoretic.  HENT:     Head: Normocephalic and atraumatic.     Mouth/Throat:     Dentition: Abnormal dentition. Does not have dentures. Dental caries present. No dental tenderness, gingival swelling or dental abscesses.      Comments: Overall poor dentition with several missing teeth.  The teeth that are still present are mostly decaying.  The tooth specifically causing pain today is decaying laterally.  No gross dental abscess or site of drainage. No facial, neck or cheek swelling noted. No pooling of secretions or trismus.   Normal voice noted with no difficulty swallowing or breathing.  No submandibular erythema, edema or crepitus noted. Eyes:     General: No scleral icterus.    Conjunctiva/sclera: Conjunctivae normal.  Neck:     Musculoskeletal: Normal range of motion.  Pulmonary:     Effort: Pulmonary effort is normal. No respiratory distress.  Skin:    Findings: No rash.  Neurological:     Mental Status: She is alert.      ED Treatments / Results  Labs (all labs ordered are listed, but only abnormal results are displayed) Labs Reviewed - No data to display  EKG None  Radiology No results found.  Procedures Procedures (including critical care time)  Medications Ordered in ED Medications - No data to display   Initial Impression / Assessment and Plan / ED Course  I have reviewed the triage vital signs and the nursing notes.  Pertinent labs & imaging results that were available during my care of the patient were reviewed by me and considered in my medical decision making (see chart for details).     Patient with dentalgia. On exam, there is no evidence of a drainable abscess. No trismus, glossal elevation, unilateral tonsillar swelling. No evidence of retropharyngeal or peritonsillar abscess or Ludwig angina. Will treat with  Augmentin and naproxen. Pt instructed to follow-up with dentist as soon as possible. Resource guide provided with AVS.  Patient is hemodynamically stable, in NAD, and able to ambulate in the ED. Evaluation does not show pathology that would require ongoing emergent intervention or inpatient treatment. I explained the diagnosis to the patient. Pain has been managed and has no complaints prior to discharge. Patient is comfortable with above plan and is stable for discharge at this time. All questions were answered prior to disposition. Strict return precautions for returning to the ED were discussed. Encouraged follow up with PCP.    Portions of this note were generated  with Scientist, clinical (histocompatibility and immunogenetics). Dictation errors may occur despite best attempts at proofreading.   Final Clinical Impressions(s) / ED Diagnoses   Final diagnoses:  Pain, dental    ED Discharge Orders         Ordered    amoxicillin-clavulanate (AUGMENTIN) 875-125 MG tablet  Every 12 hours     05/04/18 1245    naproxen (NAPROSYN) 500 MG tablet  2 times daily     05/04/18 1245           Dietrich Pates, [place] 05/04/18 1246  Doug Sou, MD 05/04/18 1356

## 2018-05-04 NOTE — ED Triage Notes (Signed)
Pt here for right lower dental pain ongoing for 2 days

## 2018-05-04 NOTE — Discharge Instructions (Signed)
Return to ED for worsening symptoms, facial swelling, trouble breathing or trouble swallowing, neck pain. Follow-up using the dental resource guide attached.

## 2018-07-21 ENCOUNTER — Telehealth (INDEPENDENT_AMBULATORY_CARE_PROVIDER_SITE_OTHER): Payer: Medicaid Other | Admitting: Family Medicine

## 2018-07-21 DIAGNOSIS — N3000 Acute cystitis without hematuria: Secondary | ICD-10-CM

## 2018-07-21 MED ORDER — NITROFURANTOIN MONOHYD MACRO 100 MG PO CAPS: 100.0000 mg | ORAL_CAPSULE | Freq: Two times a day (BID) | ORAL | 0 refills | Status: AC
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
## 2018-07-22 ENCOUNTER — Telehealth: Payer: Medicaid Other

## 2018-07-26 ENCOUNTER — Telehealth: Payer: Self-pay

## 2018-07-26 NOTE — Telephone Encounter (Signed)
Pt called nurse line stating she was recently diagnosed with UTI during a telemedicine visit. Pt stated she was out of work, 4/17-4/19. Pt is to return to work [employer]. Pt stated to add "non covid related illness." Will draft note for her, she was access on mychart.

## 2018-10-28 ENCOUNTER — Other Ambulatory Visit: Payer: Self-pay

## 2018-10-28 ENCOUNTER — Telehealth (INDEPENDENT_AMBULATORY_CARE_PROVIDER_SITE_OTHER): Payer: Medicaid Other | Admitting: Family Medicine

## 2018-10-28 DIAGNOSIS — R21 Rash and other nonspecific skin eruption: Secondary | ICD-10-CM | POA: Diagnosis not present

## 2018-10-28 MED ORDER — HYDROCORTISONE 0.5 % EX CREA: 1.0000 "application " | TOPICAL_CREAM | Freq: Two times a day (BID) | CUTANEOUS | 0 refills | Status: DC

## 2018-10-28 MED ORDER — DIPHENHYDRAMINE HCL 25 MG PO TABS: 25.0000 mg | ORAL_TABLET | Freq: Every evening | ORAL | 0 refills | Status: DC | PRN

## 2018-10-28 NOTE — Assessment & Plan Note (Signed)
1 week history of pruritic, erythematous wheal-like blotches on upper back, shoulders and upper arms with history of change in shampoo about 1-2 weeks ago.  Is likely having an allergic reaction to her shampoo.  No other new medication or detergent/soap/creams.  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  Recommended hydrocortisone cream twice daily as well as Benadryl.  Also recommended changing shampoo as she is likely having an allergic reaction as well as keeping the area clean and dry to prevent further irritation from sweating or wet clothing.

## 2018-10-28 NOTE — Progress Notes (Signed)
[place] Telemedicine Visit  Patient consented to have virtual visit. Method of visit: Video  Encounter participants: Patient: Alicia Greene - located at home Provider: Rory Percy - located at [facility] Others (if applicable): n/a  Chief Complaint: rash  HPI:  Patient reports itchy, diffuse rash over her back, shoulders, and upper arms for the past week and has gotten progressively worse.  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  She also started a new shampoo about 1-2 weeks ago.  Otherwise denies any changes in soaps or detergents.  The rash is itchy and not painful.  She has not noticed any discharge or bleeding from the rash.  Does have a mole on her mid back but it has not changed in appearance.  She denies any new medications or creams.  She denies any prior history of allergic reactions.  She is putting calamine lotion on it which helps soothe it at the time.  She is also tried an over-the-counter daily antihistamine.  Is particularly stressed about having her teeth removed and getting dentures placed so wonders if stress is playing a role.  Denies fever, mouth sores, face or tongue swelling, difficulties breathing, joint swelling or pain.  ROS: per HPI  Pertinent PMHx: HTN, DUB, h/o trichomonas  Exam:  Respiratory: Speaks in full sentences, no respiratory distress Skin: Multiple areas of erythematous wheal-like blotches on mid and upper back as well as neck though difficult to appreciate given video quality.  Not in dermatomal distribution.  No obvious discharge, pustules, vesicles, or bleeding.  Assessment/Plan:  Rash and nonspecific skin eruption 1 week history of pruritic, erythematous wheal-like blotches on upper back, shoulders and upper arms with history of change in shampoo about 1-2 weeks ago.  Is likely having an allergic reaction to her shampoo.  No other new  medication or detergent/soap/creams.  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  Recommended hydrocortisone cream twice daily as well as Benadryl.  Also recommended changing shampoo as she is likely having an allergic reaction as well as keeping the area clean and dry to prevent further irritation from sweating or wet clothing.    Time spent during visit with patient: 16 minutes

## 2018-11-01 ENCOUNTER — Other Ambulatory Visit: Payer: Self-pay | Admitting: Family Medicine

## 2018-11-01 ENCOUNTER — Telehealth: Payer: Self-pay | Admitting: *Deleted

## 2018-11-01 MED ORDER — TRIAMCINOLONE ACETONIDE 0.1 % EX CREA: 1.0000 "application " | TOPICAL_CREAM | Freq: Two times a day (BID) | CUTANEOUS | 0 refills | Status: DC

## 2018-11-01 NOTE — Telephone Encounter (Signed)
Pt is requesting something covered by medicaid instead of benadryl and hydrocortisone cream  Pharmacy suggested triamcinolone cream.   To provider who saw her. Alicia Greene, CMA

## 2018-11-01 NOTE — Telephone Encounter (Signed)
Done

## 2018-12-03 ENCOUNTER — Other Ambulatory Visit: Payer: Self-pay

## 2018-12-03 ENCOUNTER — Ambulatory Visit (INDEPENDENT_AMBULATORY_CARE_PROVIDER_SITE_OTHER): Payer: Medicaid Other | Admitting: Family Medicine

## 2018-12-03 VITALS — BP 126/64 | HR 91 | Wt 189.0 lb

## 2018-12-03 DIAGNOSIS — S60012A Contusion of left thumb without damage to nail, initial encounter: Secondary | ICD-10-CM | POA: Diagnosis not present
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
## 2019-02-09 DIAGNOSIS — F4320 Adjustment disorder, unspecified: Secondary | ICD-10-CM | POA: Diagnosis not present

## 2019-02-09 DIAGNOSIS — F919 Conduct disorder, unspecified: Secondary | ICD-10-CM | POA: Diagnosis not present

## 2019-02-22 DIAGNOSIS — F919 Conduct disorder, unspecified: Secondary | ICD-10-CM | POA: Diagnosis not present

## 2019-02-23 DIAGNOSIS — F919 Conduct disorder, unspecified: Secondary | ICD-10-CM | POA: Diagnosis not present

## 2019-03-06 ENCOUNTER — Other Ambulatory Visit: Payer: Self-pay

## 2019-03-06 ENCOUNTER — Ambulatory Visit (HOSPITAL_COMMUNITY)
Admission: EM | Admit: 2019-03-06 | Discharge: 2019-03-06 | Disposition: A | Discharge status: Home / Self Care | Payer: Medicaid Other | Attending: Family Medicine | Admitting: Family Medicine

## 2019-03-06 ENCOUNTER — Encounter (HOSPITAL_COMMUNITY): Payer: Self-pay

## 2019-03-06 DIAGNOSIS — T23271A Burn of second degree of right wrist, initial encounter: Secondary | ICD-10-CM

## 2019-03-06 DIAGNOSIS — K047 Periapical abscess without sinus: Secondary | ICD-10-CM

## 2019-03-06 MED ORDER — BACITRACIN ZINC 500 UNIT/GM EX OINT
TOPICAL_OINTMENT | CUTANEOUS | Status: AC
  Filled 2019-03-06: qty 0.9

## 2019-03-06 MED ORDER — AMOXICILLIN-POT CLAVULANATE 875-125 MG PO TABS: 1.0000 | ORAL_TABLET | Freq: Two times a day (BID) | ORAL | 0 refills | Status: DC

## 2019-03-06 MED ORDER — MUPIROCIN 2 % EX OINT: 1.0000 "application " | TOPICAL_OINTMENT | Freq: Three times a day (TID) | CUTANEOUS | 1 refills | Status: DC
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
## 2019-03-06 NOTE — Discharge Instructions (Signed)
Use the antibiotic cream 3 times a day and keep the wound covered while at work, open to air when you are not at work.
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
## 2019-03-17 DIAGNOSIS — F919 Conduct disorder, unspecified: Secondary | ICD-10-CM | POA: Diagnosis not present

## 2019-04-09 ENCOUNTER — Emergency Department (HOSPITAL_COMMUNITY)
Admission: EM | Admit: 2019-04-09 | Discharge: 2019-04-09 | Disposition: A | Discharge status: Home / Self Care | Payer: Medicaid Other | Attending: Emergency Medicine | Admitting: Emergency Medicine

## 2019-04-09 ENCOUNTER — Encounter (HOSPITAL_COMMUNITY): Payer: Self-pay | Admitting: Emergency Medicine

## 2019-04-09 ENCOUNTER — Other Ambulatory Visit: Payer: Self-pay

## 2019-04-09 DIAGNOSIS — N898 Other specified noninflammatory disorders of vagina: Secondary | ICD-10-CM

## 2019-04-09 DIAGNOSIS — F17210 Nicotine dependence, cigarettes, uncomplicated: Secondary | ICD-10-CM | POA: Diagnosis not present

## 2019-04-09 DIAGNOSIS — L292 Pruritus vulvae: Secondary | ICD-10-CM | POA: Diagnosis present

## 2019-04-09 DIAGNOSIS — Z79899 Other long term (current) drug therapy: Secondary | ICD-10-CM | POA: Insufficient documentation

## 2019-04-09 LAB — WET PREP, GENITAL
Clue Cells Wet Prep HPF POC: NONE SEEN
Sperm: NONE SEEN
Trich, Wet Prep: NONE SEEN
Yeast Wet Prep HPF POC: NONE SEEN

## 2019-04-09 LAB — POC URINE PREG, ED: Preg Test, Ur: NEGATIVE
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
## 2019-04-09 NOTE — Discharge Instructions (Addendum)
Your tests today for BV, yeast, trichomoniasis were negative.  Results for gonorrhea and chlamydia are pending. If positive, you will receive a phone call.  If negative, you will not.,  Either way, you may check online on MyChart. If positive, you will need treatment.  This can be done with your primary care doctor or at the health department. Return to the emergency room with any new, worsening, concerning symptoms.
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
## 2019-04-09 NOTE — ED Notes (Signed)
Patient verbalizes understanding of discharge instructions . Opportunity for questions and answers were provided . Armband removed by staff ,Pt discharged from ED. W/C  offered at D/C  and Declined W/C at D/C and was escorted to lobby by RN.  

## 2019-04-11 ENCOUNTER — Ambulatory Visit: Payer: Medicaid Other | Admitting: Family Medicine

## 2019-04-11 DIAGNOSIS — F919 Conduct disorder, unspecified: Secondary | ICD-10-CM | POA: Diagnosis not present

## 2019-04-11 LAB — GC/CHLAMYDIA PROBE AMP (~~LOC~~) NOT AT ARMC
Chlamydia: NEGATIVE
Neisseria Gonorrhea: NEGATIVE

## 2019-06-10 DIAGNOSIS — F919 Conduct disorder, unspecified: Secondary | ICD-10-CM | POA: Diagnosis not present

## 2019-06-29 DIAGNOSIS — F919 Conduct disorder, unspecified: Secondary | ICD-10-CM | POA: Diagnosis not present

## 2019-07-21 ENCOUNTER — Other Ambulatory Visit: Payer: Self-pay

## 2019-07-21 ENCOUNTER — Encounter: Payer: Self-pay | Admitting: Family Medicine

## 2019-07-21 ENCOUNTER — Ambulatory Visit: Payer: Medicaid Other | Admitting: Family Medicine

## 2019-07-21 VITALS — BP 140/80 | HR 76 | Ht 67.0 in | Wt 190.4 lb

## 2019-07-21 DIAGNOSIS — K648 Other hemorrhoids: Secondary | ICD-10-CM | POA: Diagnosis not present

## 2019-07-21 DIAGNOSIS — A630 Anogenital (venereal) warts: Secondary | ICD-10-CM

## 2019-07-21 DIAGNOSIS — B079 Viral wart, unspecified: Secondary | ICD-10-CM

## 2019-07-21 MED ORDER — IMIQUIMOD 5 % EX CREA: TOPICAL_CREAM | CUTANEOUS | 0 refills | Status: DC

## 2019-07-21 NOTE — Patient Instructions (Signed)
Thank you for coming to see me today. It was a pleasure! Today we talked about:   I have placed a referral to OB/GYN for your genital warts if you would like to schedule removal.  You may also try a medication called Aldara that is a  Cream for the removal, please let me know if you are interested in the cream.   Your bleeding is likely due to internal hemorrhoids. I recommend you increase the fiber in your diet. If you have any further episodes of bleeding or continued symptoms do not hesitate to follow-up in our office.  Please follow-up with me as needed.  If you have any questions or concerns, please do not hesitate to call the office at [phone].  Take Care,   Alicia Nashay Brickley, DO  Hemorrhoids Hemorrhoids are swollen veins that may develop:  In the butt (rectum). These are called internal hemorrhoids.  Around the opening of the butt (anus). These are called external hemorrhoids. Hemorrhoids can cause pain, itching, or bleeding. Most of the time, they do not cause serious problems. They usually get better with diet changes, lifestyle changes, and other home treatments. What are the causes? This condition may be caused by:  Having trouble pooping (constipation).  Pushing hard (straining) to poop.  Watery poop (diarrhea).  Pregnancy.  Being very overweight (obese).  Sitting for long periods of time.  Heavy lifting or other activity that causes you to strain.  Anal sex.  Riding a bike for a long period of time. What are the signs or symptoms? Symptoms of this condition include:  Pain.  Itching or soreness in the butt.  Bleeding from the butt.  Leaking poop.  Swelling in the area.  One or more lumps around the opening of your butt. How is this diagnosed? A doctor can often diagnose this condition by looking at the affected area. The doctor may also:  Do an exam that involves feeling the area with a gloved hand (digital rectal exam).  Examine the area  inside your butt using a small tube (anoscope).  Order blood tests. This may be done if you have lost a lot of blood.  Have you get a test that involves looking inside the colon using a flexible tube with a camera on the end (sigmoidoscopy or colonoscopy). How is this treated? This condition can usually be treated at home. Your doctor may tell you to change what you eat, make lifestyle changes, or try home treatments. If these do not help, procedures can be done to remove the hemorrhoids or make them smaller. These may involve:  Placing rubber bands at the base of the hemorrhoids to cut off their blood supply.  Injecting medicine into the hemorrhoids to shrink them.  Shining a type of light energy onto the hemorrhoids to cause them to fall off.  Doing surgery to remove the hemorrhoids or cut off their blood supply. Follow these instructions at home: Eating and drinking   Eat foods that have a lot of fiber in them. These include whole grains, beans, nuts, fruits, and vegetables.  Ask your doctor about taking products that have added fiber (fibersupplements).  Reduce the amount of fat in your diet. You can do this by: ? Eating low-fat dairy products. ? Eating less red meat. ? Avoiding processed foods.  Drink enough fluid to keep your pee (urine) pale yellow. Managing pain and swelling   Take a warm-water bath (sitz bath) for 20 minutes to ease pain. Do this 3-4  times a day. You may do this in a bathtub or using a portable sitz bath that fits over the toilet.  If told, put ice on the painful area. It may be helpful to use ice between your warm baths. ? Put ice in a plastic bag. ? Place a towel between your skin and the bag. ? Leave the ice on for 20 minutes, 2-3 times a day. General instructions  Take over-the-counter and prescription medicines only as told by your doctor. ? Medicated creams and medicines may be used as told.  Exercise often. Ask your doctor how much and what  kind of exercise is best for you.  Go to the bathroom when you have the urge to poop. Do not wait.  Avoid pushing too hard when you poop.  Keep your butt dry and clean. Use wet toilet paper or moist towelettes after pooping.  Do not sit on the toilet for a long time.  Keep all follow-up visits as told by your doctor. This is important. Contact a doctor if you:  Have pain and swelling that do not get better with treatment or medicine.  Have trouble pooping.  Cannot poop.  Have pain or swelling outside the area of the hemorrhoids. Get help right away if you have:  Bleeding that will not stop. Summary  Hemorrhoids are swollen veins in the butt or around the opening of the butt.  They can cause pain, itching, or bleeding.  Eat foods that have a lot of fiber in them. These include whole grains, beans, nuts, fruits, and vegetables.  Take a warm-water bath (sitz bath) for 20 minutes to ease pain. Do this 3-4 times a day. This information is not intended to replace advice given to you by your health care provider. Make sure you discuss any questions you have with your health care provider. Document Revised: 04/01/2018 Document Reviewed: 08/13/2017 Elsevier Patient Education  2020 Elsevier Inc.  High-Fiber Diet Fiber, also called dietary fiber, is a type of carbohydrate that is found in fruits, vegetables, whole grains, and beans. A high-fiber diet can have many health benefits. Your health care provider may recommend a high-fiber diet to help:  Prevent constipation. Fiber can make your bowel movements more regular.  Lower your cholesterol.  Relieve the following conditions: ? Swelling of veins in the anus (hemorrhoids). ? Swelling and irritation (inflammation) of specific areas of the digestive tract (uncomplicated diverticulosis). ? A problem of the large intestine (colon) that sometimes causes pain and diarrhea (irritable bowel syndrome, IBS).  Prevent overeating as part of  a weight-loss plan.  Prevent heart disease, type 2 diabetes, and certain cancers. What is my plan? The recommended daily fiber intake in grams (g) includes:  38 g for men age 38 or younger.  30 g for men over age 59.  25 g for women age 88 or younger.  21 g for women over age 55. You can get the recommended daily intake of dietary fiber by:  Eating a variety of fruits, vegetables, grains, and beans.  Taking a fiber supplement, if it is not possible to get enough fiber through your diet. What do I need to know about a high-fiber diet?  It is better to get fiber through food sources rather than from fiber supplements. There is not a lot of research about how effective supplements are.  Always check the fiber content on the nutrition facts label of any prepackaged food. Look for foods that contain 5 g of fiber or more  per serving.  Talk with a diet and nutrition specialist (dietitian) if you have questions about specific foods that are recommended or not recommended for your medical condition, especially if those foods are not listed below.  Gradually increase how much fiber you consume. If you increase your intake of dietary fiber too quickly, you may have bloating, cramping, or gas.  Drink plenty of water. Water helps you to digest fiber. What are tips for following this plan?  Eat a wide variety of high-fiber foods.  Make sure that half of the grains that you eat each day are whole grains.  Eat breads and cereals that are made with whole-grain flour instead of refined flour or white flour.  Eat brown rice, bulgur wheat, or millet instead of white rice.  Start the day with a breakfast that is high in fiber, such as a cereal that contains 5 g of fiber or more per serving.  Use beans in place of meat in soups, salads, and pasta dishes.  Eat high-fiber snacks, such as berries, raw vegetables, nuts, and popcorn.  Choose whole fruits and vegetables instead of processed forms  like juice or sauce. What foods can I eat?  Fruits Berries. Pears. Apples. Oranges. Avocado. Prunes and raisins. Dried figs. Vegetables Sweet potatoes. Spinach. Kale. Artichokes. Cabbage. Broccoli. Cauliflower. Green peas. Carrots. Squash. Grains Whole-grain breads. Multigrain cereal. Oats and oatmeal. Brown rice. Barley. Bulgur wheat. [place]. Quinoa. Bran muffins. Popcorn. Rye wafer crackers. Meats and other proteins Navy, kidney, and pinto beans. Soybeans. Split peas. Lentils. Nuts and seeds. Dairy Fiber-fortified yogurt. Beverages Fiber-fortified soy milk. Fiber-fortified orange juice. Other foods Fiber bars. The items listed above may not be a complete list of recommended foods and beverages. Contact a dietitian for more options. What foods are not recommended? Fruits Fruit juice. Cooked, strained fruit. Vegetables Fried potatoes. Canned vegetables. Well-cooked vegetables. Grains White bread. Pasta made with refined flour. White rice. Meats and other proteins Fatty cuts of meat. Fried chicken or fried fish. Dairy Milk. Yogurt. Cream cheese. Sour cream. Fats and oils Butters. Beverages Soft drinks. Other foods Cakes and pastries. The items listed above may not be a complete list of foods and beverages to avoid. Contact a dietitian for more information. Summary  Fiber is a type of carbohydrate. It is found in fruits, vegetables, whole grains, and beans.  There are many health benefits of eating a high-fiber diet, such as preventing constipation, lowering blood cholesterol, helping with weight loss, and reducing your risk of heart disease, diabetes, and certain cancers.  Gradually increase your intake of fiber. Increasing too fast can result in cramping, bloating, and gas. Drink plenty of water while you increase your fiber.  The best sources of fiber include whole fruits and vegetables, whole grains, nuts, seeds, and beans. This information is not intended to replace  advice given to you by your health care provider. Make sure you discuss any questions you have with your health care provider. Document Revised: 01/26/2017 Document Reviewed: 01/26/2017 Elsevier Patient Education  2020 [employer].

## 2019-07-21 NOTE — Progress Notes (Signed)
   SUBJECTIVE:   CHIEF COMPLAINT / HPI:   BRBPR: Patient reports that she had a large amount of blood in the toilet following a straining bowel movement that was painless. She has had this happen every 6-12 months for about 5 years and has not seen a doctor about it before. She denies being on her period but states it looked like period blood in the toilet. She denies any rectal pain, itching,. She denies any abdominal pain, dark stools, fevers, nausea, vomiting. She reports She has not had bleeding since. She has not had any unintentional weight loss and denies any known family hx of colon cancer or IBD. She denies any trauma to her rectum. She denies any CP, SOB, dizziness, lightheadedness, palpitations.   Genital Warts: Patient previously has had genital warts removed by cryotherapy and has a small area she would like removed now. She has not tried any over the counter method.   Common wart on hand: Patient has tried OTC salicylic acid with no success. She reports no bleeding or discharge of the area. It is between her fingers which makes it more difficult for her to treat at home. She is interested in removal with cryotherapy today.    PERTINENT  PMH / PSH: *HPV  OBJECTIVE:  BP 140/80   Pulse 76   Ht 5\' 7"  (1.702 m)   Wt 190 lb 6 oz (86.4 kg)   LMP 07/04/2019   SpO2 99%   BMI 29.82 kg/m   General: NAD, pleasant Neck: Supple, no LAD Respiratory:  normal work of breathing Neuro: CN II-XII grossly intact Psych: AOx3, appropriate affect  ASSESSMENT/PLAN:   HPV (human papilloma virus) anogenital infection Patient previously has had cryotherapy and is interested in having this again. OB/GYN referral placed and patient may try aldera cream in the meantime if she can afford it and if it works then she will not schedule for procedure.   Viral warts Cutaneous wart on R third digit. Failed salicylic acid outpatient and used cryotherapy today for removal.   Diagnosis: common cutaneous  wart- Location: R third digit Procedure: cryotherapy Informed consent:  Discussed risks ( pain, and recurrence of the condition) and benefits of the procedure, as well as the alternatives.  Informed consent was obtained. A 2 cm area around wart was frozen.  The patient tolerated the procedure well. The patient was instructed on post-op care.  Internal hemorrhoids without complication Patient with painless BRBPR during straining for BM x1. No family hx of IBD or colon cancer. No sx of anemia. Likely internal hemorrhoid given history. Patient offered anoscopy exam today to further evaluate, but she will defer unless she has another recurrence. Counseled on need for increase of fiber in diet and water intake to decrease bleeding. Patient is also smoker, and she plans to try to quit in the future. Age appropriate colonoscopy unless continued symptoms.     07/06/2019 Malavika Lira, DO PGY-3, [facility]

## 2019-07-24 ENCOUNTER — Encounter: Payer: Self-pay | Admitting: Family Medicine

## 2019-07-24 DIAGNOSIS — K648 Other hemorrhoids: Secondary | ICD-10-CM | POA: Insufficient documentation

## 2019-07-24 NOTE — Assessment & Plan Note (Signed)
Patient with painless BRBPR during straining for BM x1. No family hx of IBD or colon cancer. No sx of anemia. Likely internal hemorrhoid given history. Patient offered anoscopy exam today to further evaluate, but she will defer unless she has another recurrence. Counseled on need for increase of fiber in diet and water intake to decrease bleeding. Patient is also smoker, and she plans to try to quit in the future. Age appropriate colonoscopy unless continued symptoms.

## 2019-07-24 NOTE — Assessment & Plan Note (Signed)
Patient previously has had cryotherapy and is interested in having this again. OB/GYN referral placed and patient may try aldera cream in the meantime if she can afford it and if it works then she will not schedule for procedure.

## 2019-07-24 NOTE — Assessment & Plan Note (Signed)
Cutaneous wart on R third digit. Failed salicylic acid outpatient and used cryotherapy today for removal.   Diagnosis: common cutaneous wart- Location: R third digit Procedure: cryotherapy Informed consent:  Discussed risks ( pain, and recurrence of the condition) and benefits of the procedure, as well as the alternatives.  Informed consent was obtained. A 2 cm area around wart was frozen.  The patient tolerated the procedure well. The patient was instructed on post-op care.

## 2019-08-23 ENCOUNTER — Telehealth: Payer: Self-pay | Admitting: Student in an Organized Health Care Education/Training Program

## 2019-08-23 NOTE — Telephone Encounter (Signed)
**  After Hours/ Emergency Line Call*  CC: vaginal bleeding.  S: Started current period May 15th. A week earlier than expected. Normally regular cycles which she documents thoroughly due to having a younger sexual partner who sometimes aggravates her periods while having intercourse.  Her concern is for the amount of bleeding and whether this is an ectopic pregnancy.  First few days of period were slightly heavier than normal but yesterday was very heavy and bled through pad. Today x2 as well.  Blood clot today the size of about 3-4 quarters. Confirmed from vagina and not rectum. No other bleeding. Other symptoms include Fatigue since working 10-10.5 hour days and very irritable. Onset of some mild cramping today in abdomen. No medication for the pain. Has BTL. Is sexually active but not during this episode.  Denies lightheadedness or dizziness.  No SOB or CP. No history of anemia or bleeding and not on any blood thinners.  O: General: sounds anxious. No dyspnea.  AP: - vaginal bleeding likely 2/2 fibroid vs dysfunctional endometrium vs ovarian failure vs malignancy. Unlikely pregnancy given BTL and not in acute pain indicating ectopic.  -Seen for similar symptoms 11/2017 and started workup for premenopause. Normal pelvic exam and non-abnormal LH/FSH at that time (4.3/2.9). it does not appear that she had follow up after that visit on this issue. - already scheduled with gyn clinic in 2 days for genital warts. Offered patient another sooner appointment to discuss this topic but since she is stable and not having symptoms of anemia, did not feel that she needed to be seen emergently.  - patient elected to follow up as already scheduled on Thursday with Dr. Debroah Loop.  - gave patient precautions for calling back or presenting to ED. She expressed understanding and agreement with the plan.    Jamelle Rushing, DO PGY-2 Family Medicine Residency

## 2019-08-25 ENCOUNTER — Other Ambulatory Visit: Payer: Self-pay

## 2019-08-25 ENCOUNTER — Encounter: Payer: Self-pay | Admitting: Obstetrics & Gynecology

## 2019-08-25 ENCOUNTER — Ambulatory Visit (INDEPENDENT_AMBULATORY_CARE_PROVIDER_SITE_OTHER): Payer: Medicaid Other | Admitting: Obstetrics & Gynecology

## 2019-08-25 VITALS — BP 129/79 | HR 82 | Wt 192.6 lb

## 2019-08-25 DIAGNOSIS — A630 Anogenital (venereal) warts: Secondary | ICD-10-CM | POA: Diagnosis not present

## 2019-08-25 MED ORDER — IMIQUIMOD 5 % EX CREA: TOPICAL_CREAM | CUTANEOUS | 0 refills | Status: DC

## 2019-08-25 NOTE — Progress Notes (Signed)
Patient ID: Alicia Greene, female   DOB: 1975-11-12, 44 y.o.   MRN: [id number]  Chief Complaint  Patient presents with  . gential warts    HPI Alicia Greene is a 44 y.o. female.  [id number] No LMP recorded. She has a long ( 10 year ) hCryotherapyistory of genital/anal warts and is currently using aldara prescribed by her PCP. She was given 4 week supply. She feels irritated and has held off on application on occasion. Cryotherapy requested HPI  Past Medical History:  Diagnosis Date  . Bronchitis   . HPV (human papilloma virus) anogenital infection   . Trichimoniasis   . Trichomoniasis 07/15/2016    Past Surgical History:  Procedure Laterality Date  . FEMUR FRACTURE SURGERY Left    when 44 yr old  . TUBAL LIGATION  2016    Family History  Problem Relation Age of Onset  . Hypertension Mother   . Hearing loss Mother   . Alcohol abuse Mother   . Heart disease Mother   . High Cholesterol Mother   . Diabetes Father   . Depression Father   . High Cholesterol Father   . Hypertension Father   . Hearing loss Sister   . Heart disease Maternal Grandmother   . Hearing loss Maternal Grandfather   . Diabetes Paternal Grandmother   . Stroke Paternal Grandmother     Social History Social History   Tobacco Use  . Smoking status: Current Some Day Smoker    Packs/day: 1.00    Years: 20.00    Pack years: 20.00    Types: Cigarettes  . Smokeless tobacco: Never Used  . Tobacco comment: previously used e-cigarette  Substance Use Topics  . Alcohol use: Yes    Alcohol/week: 1.0 standard drinks    Types: 1 Glasses of wine per week  . Drug use: No    No Known Allergies  Current Outpatient Medications  Medication Sig Dispense Refill  . imiquimod (ALDARA) 5 % cream Apply topically 3 (three) times a week. 12 each 0   No current facility-administered medications for this visit.    Review of Systems Review of Systems  Constitutional: Negative.   Respiratory: Negative.    Cardiovascular: Negative.   Gastrointestinal: Positive for rectal pain.  Genitourinary: Positive for menstrual problem. Negative for vaginal bleeding.    Blood pressure 129/79, pulse 82, weight 192 lb 9.6 oz (87.4 kg).  Physical Exam Physical Exam Constitutional:      Appearance: Normal appearance. She is not ill-appearing.  Pulmonary:     Effort: Pulmonary effort is normal.  Abdominal:     General: Abdomen is flat.  Genitourinary:    General: Normal vulva.     Comments: Perianal wart and external hemorrhoids Skin:    General: Skin is warm and dry.  Neurological:     Mental Status: She is alert.  Psychiatric:        Mood and Affect: Mood normal.     Data Reviewed Negative pap 2019  Assessment Perianal wart recurrent, treated with imiquomod  Plan Complete course of aldara Genl surgery referral made    Scheryl Darter 08/25/2019, 1:30 PM

## 2019-08-25 NOTE — Patient Instructions (Signed)
Genital Warts Genital warts are small growths in the area around the genitals or the anus. They are caused by a type of germ (HPV virus). This germ is spread from person to person during sex. It can be spread through vaginal, anal, and oral sex. Genital warts can lead to other problems if they are not treated. A person is more likely to have this condition if he or she:  Has sex without using a condom.  Has sex with many people.  Has sex before the age of 16.  Has a weak body defense (immune) system. This condition can be treated with medicines. Your doctor may also burn or freeze the warts. In some cases, surgery may be done to remove the warts. Follow these instructions at home: Medicines   Apply over-the-counter and prescription medicines only as told by your doctor.  Do not use medicines that are meant for treating hand warts.  Talk with your doctor about using creams to treat itching. Instructions for women  Plan to have regular tests to check for cervical cancer. Your risk for this cancer increases when you have genital warts.  If you become pregnant, tell your doctor that you have had genital warts. The germ can be passed to the baby. General instructions  Do not touch or scratch the warts.  Do not have sex until your treatment is done.  Tell your current and past sexual partners about your condition. They may need treatment.  After treatment, use condoms during sex.  Keep all follow-up visits as told by your doctor. This is important. How is this prevented? Talk with your doctor about getting the HPV shot. The HPV shot:  Can help stop some HPV infections and cancers.  Is given to males and females who are 11-26 years old.  Will not work if you already have HPV.  Is not recommended for pregnant women. Contact a doctor if:  You have redness, swelling, or pain in the area of the treated skin.  You have a fever.  You feel sick.  You feel lumps in the area  around your genitals or anus.  You have bleeding in the area around your genitals or anus.  You have pain during sex. Summary  Genital warts are small growths in the areas around the genitals or the anus. They are caused by a type of germ (HPV virus).  The germ is spread by having vaginal, anal, or oral sex without using a condom.  This condition is treated using medicines. In some cases, freezing, burning, or surgery may be done to get rid of the warts.  This condition may be prevented by getting a HPV shot. This information is not intended to replace advice given to you by your health care provider. Make sure you discuss any questions you have with your health care provider. Document Revised: 04/28/2017 Document Reviewed: 04/28/2017 Elsevier Patient Education  2020 [employer] Inc[employer]  

## 2019-09-06 ENCOUNTER — Encounter: Payer: Self-pay | Admitting: Family Medicine

## 2019-09-06 ENCOUNTER — Ambulatory Visit (INDEPENDENT_AMBULATORY_CARE_PROVIDER_SITE_OTHER): Payer: Medicaid Other | Admitting: Family Medicine

## 2019-09-06 ENCOUNTER — Other Ambulatory Visit: Payer: Self-pay

## 2019-09-06 ENCOUNTER — Other Ambulatory Visit (HOSPITAL_COMMUNITY)
Admission: RE | Admit: 2019-09-06 | Discharge: 2019-09-06 | Disposition: A | Discharge status: Home / Self Care | Payer: Medicaid Other | Source: Ambulatory Visit | Attending: Family Medicine | Admitting: Family Medicine

## 2019-09-06 VITALS — BP 124/74 | HR 90 | Wt 191.0 lb

## 2019-09-06 DIAGNOSIS — N898 Other specified noninflammatory disorders of vagina: Secondary | ICD-10-CM | POA: Diagnosis not present

## 2019-09-06 DIAGNOSIS — B9689 Other specified bacterial agents as the cause of diseases classified elsewhere: Secondary | ICD-10-CM | POA: Diagnosis not present

## 2019-09-06 DIAGNOSIS — Z1159 Encounter for screening for other viral diseases: Secondary | ICD-10-CM

## 2019-09-06 DIAGNOSIS — N760 Acute vaginitis: Secondary | ICD-10-CM | POA: Diagnosis not present

## 2019-09-06 DIAGNOSIS — Z114 Encounter for screening for human immunodeficiency virus [HIV]: Secondary | ICD-10-CM | POA: Diagnosis not present

## 2019-09-06 LAB — POCT WET PREP (WET MOUNT)
Clue Cells Wet Prep Whiff POC: POSITIVE
Trichomonas Wet Prep HPF POC: ABSENT
WBC, Wet Prep HPF POC: >20

## 2019-09-06 MED ORDER — FLUCONAZOLE 150 MG PO TABS: 150.0000 mg | ORAL_TABLET | Freq: Once | ORAL | 0 refills | Status: AC

## 2019-09-06 MED ORDER — METRONIDAZOLE 500 MG PO TABS: 500.0000 mg | ORAL_TABLET | Freq: Two times a day (BID) | ORAL | 0 refills | Status: AC

## 2019-09-06 NOTE — Progress Notes (Signed)
° °  SUBJECTIVE:   CHIEF COMPLAINT / HPI:   Vaginal discharge, concern for STI: Patient reports that on 510/21 she had a sexual encounter with her regular partner for the past 3 years.  She states that on 5/15 she then had her period which she noted that on 5/17 it was heavier bleeding and she passed many clots.  It ended on 5/22.  She states that since then she has had stringy discharge.  She noticed the discharge really started on 5/17.  She reports that they do not use condoms when they have intercourse.  She is worried that she may have cheated on her and that she might have an STI given her symptoms.  Patient reports that she has had some sort of slight odor.  She reports that the discharge has been white and thin.  She reports she has had chlamydia in the past when she was a teenager.  She has also had a history of BV.  She denies any abdominal pain, fever.  She has previously had a tubal ligation for contraception.  PERTINENT  PMH / PSH: No significant past medical history  OBJECTIVE:  BP 124/74    Pulse 90    Wt 191 lb (86.6 kg)    LMP 08/20/2019    SpO2 97%    BMI 29.91 kg/m   General: NAD, pleasant Neck: Supple Respiratory: normal work of breathing Psych: AOx3, appropriate affect GU/GYN: External genitalia within normal limits.  Vaginal mucosa pink, moist, normal rugae.  Nonfriable cervix without lesions, some thin white discharge, no bleeding noted on speculum exam. Exam performed in the presence of a chaperone.  ASSESSMENT/PLAN:   BV (bacterial vaginosis) confirmed on wet prep. G/C Percell Locus is negative. Symptoms consistent with this.  - Treatment with Flagyl 500 BID x 7 days. Diflucan if develops yeast infection. - F/U if symptoms not improving or getting worse.   - F/U as needed.  - Return precautions including abdominal pain, fever, chills, nausea, or vomiting given.      [place] Olyver Hawes, DO PGY-3, [facility]

## 2019-09-06 NOTE — Patient Instructions (Addendum)
Thank you for coming to see me today. It was a pleasure! Today we talked about:   I will release your results on my chart and call you if anything is abnormal. I have sent in metronidazole to your pharmacy and diflucan if your develop a yeast infection after.   Please follow-up with our clinic if you have any new symptoms or sooner as needed.  If you have any questions or concerns, please do not hesitate to call the office at [phone].  Take Care,   [place] Khrista Braun, DO

## 2019-09-07 LAB — TSH: TSH: 2.44 u[IU]/mL (ref 0.450–4.500)

## 2019-09-07 LAB — HEPATITIS C ANTIBODY: Hep C Virus Ab: <0.1 s/co ratio (ref 0.0–0.9)

## 2019-09-07 LAB — RPR: RPR Ser Ql: NONREACTIVE

## 2019-09-07 LAB — HIV ANTIBODY (ROUTINE TESTING W REFLEX): HIV Screen 4th Generation wRfx: NONREACTIVE

## 2019-09-08 DIAGNOSIS — N760 Acute vaginitis: Secondary | ICD-10-CM

## 2019-09-08 DIAGNOSIS — B9689 Other specified bacterial agents as the cause of diseases classified elsewhere: Secondary | ICD-10-CM | POA: Insufficient documentation

## 2019-09-08 HISTORY — DX: Acute vaginitis: B96.89

## 2019-09-08 HISTORY — DX: Other specified bacterial agents as the cause of diseases classified elsewhere: N76.0

## 2019-09-08 LAB — CERVICOVAGINAL ANCILLARY ONLY
Chlamydia: NEGATIVE
Comment: NEGATIVE
Comment: NORMAL
Neisseria Gonorrhea: NEGATIVE

## 2019-09-08 NOTE — Assessment & Plan Note (Signed)
confirmed on wet prep. G/C Percell Locus is negative. Symptoms consistent with this.  - Treatment with Flagyl 500 BID x 7 days. Diflucan if develops yeast infection. - F/U if symptoms not improving or getting worse.   - F/U as needed.  - Return precautions including abdominal pain, fever, chills, nausea, or vomiting given.

## 2019-09-12 DIAGNOSIS — F919 Conduct disorder, unspecified: Secondary | ICD-10-CM | POA: Diagnosis not present

## 2019-09-19 DIAGNOSIS — F919 Conduct disorder, unspecified: Secondary | ICD-10-CM | POA: Diagnosis not present

## 2019-09-21 ENCOUNTER — Encounter: Payer: Self-pay | Admitting: Family Medicine

## 2019-09-26 DIAGNOSIS — K644 Residual hemorrhoidal skin tags: Secondary | ICD-10-CM | POA: Diagnosis not present

## 2019-10-11 DIAGNOSIS — F919 Conduct disorder, unspecified: Secondary | ICD-10-CM | POA: Diagnosis not present

## 2019-12-13 DIAGNOSIS — Z79899 Other long term (current) drug therapy: Secondary | ICD-10-CM | POA: Diagnosis not present

## 2019-12-15 DIAGNOSIS — Z79899 Other long term (current) drug therapy: Secondary | ICD-10-CM | POA: Diagnosis not present

## 2019-12-16 DIAGNOSIS — R062 Wheezing: Secondary | ICD-10-CM | POA: Diagnosis not present

## 2019-12-16 DIAGNOSIS — J40 Bronchitis, not specified as acute or chronic: Secondary | ICD-10-CM | POA: Diagnosis not present

## 2019-12-16 DIAGNOSIS — B373 Candidiasis of vulva and vagina: Secondary | ICD-10-CM | POA: Diagnosis not present

## 2019-12-20 DIAGNOSIS — Z79899 Other long term (current) drug therapy: Secondary | ICD-10-CM | POA: Diagnosis not present

## 2019-12-22 DIAGNOSIS — Z79899 Other long term (current) drug therapy: Secondary | ICD-10-CM | POA: Diagnosis not present

## 2019-12-27 DIAGNOSIS — Z79899 Other long term (current) drug therapy: Secondary | ICD-10-CM | POA: Diagnosis not present

## 2019-12-29 DIAGNOSIS — Z79899 Other long term (current) drug therapy: Secondary | ICD-10-CM | POA: Diagnosis not present

## 2020-01-02 DIAGNOSIS — Z79899 Other long term (current) drug therapy: Secondary | ICD-10-CM | POA: Diagnosis not present

## 2020-01-04 DIAGNOSIS — Z79899 Other long term (current) drug therapy: Secondary | ICD-10-CM | POA: Diagnosis not present

## 2020-01-06 ENCOUNTER — Encounter: Payer: Self-pay | Admitting: Family Medicine

## 2020-01-06 ENCOUNTER — Other Ambulatory Visit: Payer: Self-pay

## 2020-01-06 ENCOUNTER — Ambulatory Visit (INDEPENDENT_AMBULATORY_CARE_PROVIDER_SITE_OTHER): Payer: Medicaid Other | Admitting: Family Medicine

## 2020-01-06 ENCOUNTER — Other Ambulatory Visit (HOSPITAL_COMMUNITY)
Admission: RE | Admit: 2020-01-06 | Discharge: 2020-01-06 | Disposition: A | Discharge status: Home / Self Care | Payer: Medicaid Other | Source: Ambulatory Visit | Attending: Family Medicine | Admitting: Family Medicine

## 2020-01-06 VITALS — BP 118/80 | HR 88 | Ht 67.0 in | Wt 196.8 lb

## 2020-01-06 DIAGNOSIS — N898 Other specified noninflammatory disorders of vagina: Secondary | ICD-10-CM | POA: Insufficient documentation

## 2020-01-06 DIAGNOSIS — N760 Acute vaginitis: Secondary | ICD-10-CM | POA: Diagnosis not present

## 2020-01-06 DIAGNOSIS — B9689 Other specified bacterial agents as the cause of diseases classified elsewhere: Secondary | ICD-10-CM

## 2020-01-06 LAB — POCT URINALYSIS DIP (MANUAL ENTRY)
Bilirubin, UA: NEGATIVE
Blood, UA: NEGATIVE
Glucose, UA: NEGATIVE mg/dL
Ketones, POC UA: NEGATIVE mg/dL
Leukocytes, UA: NEGATIVE
Nitrite, UA: NEGATIVE
Protein Ur, POC: NEGATIVE mg/dL
Spec Grav, UA: 1.015 (ref 1.010–1.025)
Urobilinogen, UA: 0.2 E.U./dL
pH, UA: 5.5 (ref 5.0–8.0)

## 2020-01-06 LAB — POCT WET PREP (WET MOUNT)
Clue Cells Wet Prep Whiff POC: POSITIVE
Trichomonas Wet Prep HPF POC: ABSENT

## 2020-01-06 NOTE — Progress Notes (Signed)
° ° °  SUBJECTIVE:   CHIEF COMPLAINT / HPI: concern for BV  Last time she had intercourse was in a jacuzzi suite on Aug 28 with monogamous partner. Had menses afterwards on Aug 8 and then again on Sept 2. Reports having orange vaginal discharge. She then had another episode of light bleeding on 9/24 for three days. Denies dysuria, denies pruritis. Has had history of renal infection before and reports some back pain that was different compared to the brief back discomfort she had during this episode. Denies fevers, chills.   PERTINENT  PMH / PSH:  Recurrent BV   OBJECTIVE:   BP 118/80    Pulse 88    Ht 5\' 7"  (1.702 m)    Wt 196 lb 12.8 oz (89.3 kg)    LMP 12/30/2019 (Exact Date)    SpO2 98%    BMI 30.82 kg/m    General: female appearing stated age in no acute distress Abdomen: Bowel sounds normal. Abdomen soft and non-tender. No CVA tenderness  GU: cervix w/o erythema, no drainage of blood of purulent fluid from external os, no cervical motion tenderness, minimal discharge visualized, brown macula noted on cervical surface   ASSESSMENT/PLAN:   BV (bacterial vaginosis) - Wet prep positive for clue cells.  - metronidazole prescribed for BV  - patient to follow up in colpo clinic for repeated visualization of cervix  - U/A      01/01/2020, MD [facility]

## 2020-01-06 NOTE — Patient Instructions (Signed)
It was a pleasure to see you today!  Thank you for choosing [facility] for your primary care.  Alicia Greene was seen for vaginal spotting.   Our plans for today were:  Please continue to monitor for any more spotting or burning with urination.   I will notify you of any abnormal results.   Urine does not appear to have any infection.    Best Wishes,   Dr. Neita Garnet

## 2020-01-09 ENCOUNTER — Encounter: Payer: Self-pay | Admitting: Family Medicine

## 2020-01-09 ENCOUNTER — Other Ambulatory Visit: Payer: Self-pay | Admitting: Family Medicine

## 2020-01-09 LAB — CERVICOVAGINAL ANCILLARY ONLY
Chlamydia: NEGATIVE
Comment: NEGATIVE
Comment: NORMAL
Neisseria Gonorrhea: NEGATIVE

## 2020-01-09 MED ORDER — METRONIDAZOLE 500 MG PO TABS: 500.0000 mg | ORAL_TABLET | Freq: Three times a day (TID) | ORAL | 0 refills | Status: DC

## 2020-01-09 NOTE — Progress Notes (Signed)
Treatment for BV given results of wet prep.

## 2020-01-09 NOTE — Assessment & Plan Note (Addendum)
-   Wet prep positive for clue cells.  - metronidazole prescribed for BV  - patient to follow up in colpo clinic for repeated visualization of cervix  - U/A

## 2020-01-26 ENCOUNTER — Other Ambulatory Visit: Payer: Self-pay

## 2020-01-26 ENCOUNTER — Ambulatory Visit (INDEPENDENT_AMBULATORY_CARE_PROVIDER_SITE_OTHER): Payer: Medicaid Other | Admitting: Family Medicine

## 2020-01-26 VITALS — BP 122/78 | HR 82 | Ht 67.0 in | Wt 197.6 lb

## 2020-01-26 DIAGNOSIS — Z23 Encounter for immunization: Secondary | ICD-10-CM | POA: Diagnosis not present

## 2020-01-26 DIAGNOSIS — Z01419 Encounter for gynecological examination (general) (routine) without abnormal findings: Secondary | ICD-10-CM | POA: Insufficient documentation

## 2020-01-26 NOTE — Progress Notes (Signed)
   Covid-19 Vaccination Clinic  Name:  Alicia Greene    MRN: [id number] DOB: Dec 04, 1975  01/26/2020   [name] was observed post Covid-19 immunization for 15 minutes without incident. She was provided with Vaccine Information Sheet and instruction to access the V-Safe system.   [name] was instructed to call 911 with any severe reactions post vaccine: [name] Kitchen Difficulty breathing  . Swelling of face and throat  . A fast heartbeat  . A bad rash all over body  . Dizziness and weakness    Patient provided with immunization card and follow up nurse visit for second COVID vaccination.   [name], RN

## 2020-01-26 NOTE — Assessment & Plan Note (Signed)
Cervical exam with colposcope completed with no abnormalities identified. Patient with history of abnormal Pap and positive for high-risk HPV in 2014 Last Pap in 2019 was normal, patient scheduled for early Pap smear in November 2021 with Dr. Neita Garnet given history of abnormal Pap and recommendation for to normal Pap smears prior to resuming regular Pap smear schedule

## 2020-01-26 NOTE — Progress Notes (Signed)
° ° °  SUBJECTIVE:   CHIEF COMPLAINT / HPI: follow up cervical exam   Patient presents to gynecology clinic for gynecologic exam due to abnormal cervical findings at previous appointment.  Today, patient reports that her menses has just ended and that she is having no pelvic pain or abnormal vaginal discharge.  Patient has had a normal Pap smear in 2019.  Patient has history of Pap smear positive for high risk HPV with follow-up Pap smear that was normal.  Patient denies any history of colposcopy or cervical biopsy procedures.  PERTINENT  PMH / PSH:   OBJECTIVE:   BP 122/78    Pulse 82    Ht 5\' 7"  (1.702 m)    Wt 197 lb 9.6 oz (89.6 kg)    LMP 01/21/2020    SpO2 97%    BMI 30.95 kg/m    General: anxious appearing female, sitting in exam room chair  Genitalia:  Normal introitus for age, no external lesions, brown/tan vaginal discharge, mucosa pink and moist, no vaginal or cervical lesions, no vaginal atrophy, no friaility or hemorrhage, colposcopy exam with columnosquamous zone visualized and no suspicious lesions visualized   ASSESSMENT/PLAN:   Gynecologic exam normal Cervical exam with colposcope completed with no abnormalities identified. Patient with history of abnormal Pap and positive for high-risk HPV in 2014 Last Pap in 2019 was normal, patient scheduled for early Pap smear in November 2021 with Dr. December 2021 given history of abnormal Pap and recommendation for to normal Pap smears prior to resuming regular Pap smear schedule     Neita Garnet, MD [facility]

## 2020-01-26 NOTE — Patient Instructions (Addendum)
It was a pleasure to see you today!  Thank you for choosing [facility] for your primary care.  Alicia Greene was seen for follow cervical exam.   Our plans for today were:  We completed  another cervical exam today and it did not show any abnormalities, you will need a repeat Pap smear in 2022.   No further follow-up needed regarding cervical exams at this time.   You should return to our clinic in as needed  Best Wishes,   Dr. Neita Garnet

## 2020-02-16 ENCOUNTER — Other Ambulatory Visit (HOSPITAL_COMMUNITY)
Admission: RE | Admit: 2020-02-16 | Discharge: 2020-02-16 | Disposition: A | Discharge status: Home / Self Care | Payer: Medicaid Other | Source: Ambulatory Visit | Attending: Family Medicine | Admitting: Family Medicine

## 2020-02-16 ENCOUNTER — Ambulatory Visit (INDEPENDENT_AMBULATORY_CARE_PROVIDER_SITE_OTHER): Payer: Medicaid Other | Admitting: Family Medicine

## 2020-02-16 ENCOUNTER — Ambulatory Visit (INDEPENDENT_AMBULATORY_CARE_PROVIDER_SITE_OTHER): Payer: Medicaid Other

## 2020-02-16 ENCOUNTER — Other Ambulatory Visit: Payer: Self-pay

## 2020-02-16 ENCOUNTER — Encounter: Payer: Self-pay | Admitting: Family Medicine

## 2020-02-16 VITALS — BP 134/74 | HR 70 | Wt 198.8 lb

## 2020-02-16 DIAGNOSIS — Z124 Encounter for screening for malignant neoplasm of cervix: Secondary | ICD-10-CM

## 2020-02-16 DIAGNOSIS — Z23 Encounter for immunization: Secondary | ICD-10-CM | POA: Diagnosis not present

## 2020-02-16 DIAGNOSIS — Z01419 Encounter for gynecological examination (general) (routine) without abnormal findings: Secondary | ICD-10-CM | POA: Diagnosis not present

## 2020-02-16 NOTE — Patient Instructions (Signed)
You for choosing [place] for your visit today.  Today we completed a repeat Pap smear after your colposcopy procedure. I will follow with you for any abnormal results, we expect these results to be available sometime next week.  Follow-up with our clinic as needed.

## 2020-02-16 NOTE — Progress Notes (Signed)
     SUBJECTIVE:   CHIEF COMPLAINT / HPI: follow up for repeat Pap smear  Paitent reports that she has recently started her menses. She denies any abnormal vaginal bleeding or discharge since her last visit for the colposcopy. She denies any lower abdominal pain or urinary symptoms.   PERTINENT  PMH / PSH:  Hx of HPV  Hx of BV   OBJECTIVE:   BP 134/74   Pulse 70   Wt 198 lb 12.8 oz (90.2 kg)   LMP 01/21/2020   SpO2 98%   BMI 31.14 kg/m   General: female appearing stated age in NAD  Genitalia:  Normal introitus for age, no external lesions, menstrual bleeding pooled in vaginal vault,  mucosa pink and moist, no vaginal or cervical lesions, no vaginal atrophy, no fragility  ASSESSMENT/PLAN:   Gynecologic exam normal Patient here to follow up for repeat pap smear after being seen in colposcopy clinic per Dr. Lum Babe. No changes in the interim since her last visit.  - pap smear completed      Ronnald Ramp, MD [facility]

## 2020-02-17 NOTE — Progress Notes (Signed)
   Covid-19 Vaccination Clinic  Name:  Alicia Greene    MRN: [id number] DOB: 1976-02-17  02/17/2020   Patient presents to nurse clinic for second COVID vaccination. Patient denies previous history of allergic reaction and answers no to all screening questions. Administered in LD, site unremarkable, tolerated injection well.   [name] was observed post Covid-19 immunization for 15 minutes without incident. She was provided with Vaccine Information Sheet and instruction to access the V-Safe system.   [name] was instructed to call 911 with any severe reactions post vaccine: [name] Kitchen Difficulty breathing  . Swelling of face and throat  . A fast heartbeat  . A bad rash all over body  . Dizziness and weakness  Patient provided with updated immunization record and card.   [name], RN

## 2020-02-19 NOTE — Assessment & Plan Note (Addendum)
Patient here to follow up for repeat pap smear after being seen in colposcopy clinic per Dr. Lum Babe. No changes in the interim since her last visit.  - pap smear completed

## 2020-02-21 LAB — CYTOLOGY - PAP
Comment: NEGATIVE
Diagnosis: UNDETERMINED — AB
High risk HPV: POSITIVE — AB

## 2020-04-09 ENCOUNTER — Emergency Department (HOSPITAL_COMMUNITY): Payer: Medicaid Other

## 2020-04-09 ENCOUNTER — Emergency Department (HOSPITAL_COMMUNITY)
Admission: EM | Admit: 2020-04-09 | Discharge: 2020-04-09 | Disposition: A | Discharge status: Home / Self Care | Payer: Medicaid Other | Attending: Emergency Medicine | Admitting: Emergency Medicine

## 2020-04-09 ENCOUNTER — Other Ambulatory Visit: Payer: Self-pay

## 2020-04-09 DIAGNOSIS — S7001XA Contusion of right hip, initial encounter: Secondary | ICD-10-CM | POA: Insufficient documentation

## 2020-04-09 DIAGNOSIS — F17210 Nicotine dependence, cigarettes, uncomplicated: Secondary | ICD-10-CM | POA: Insufficient documentation

## 2020-04-09 DIAGNOSIS — S79911A Unspecified injury of right hip, initial encounter: Secondary | ICD-10-CM | POA: Diagnosis present

## 2020-04-09 DIAGNOSIS — W19XXXA Unspecified fall, initial encounter: Secondary | ICD-10-CM

## 2020-04-09 DIAGNOSIS — W108XXA Fall (on) (from) other stairs and steps, initial encounter: Secondary | ICD-10-CM | POA: Insufficient documentation

## 2020-04-09 DIAGNOSIS — M25551 Pain in right hip: Secondary | ICD-10-CM | POA: Diagnosis not present

## 2020-04-09 DIAGNOSIS — Z043 Encounter for examination and observation following other accident: Secondary | ICD-10-CM | POA: Diagnosis not present

## 2020-04-09 LAB — POC URINE PREG, ED: Preg Test, Ur: NEGATIVE

## 2020-04-09 MED ORDER — ACETAMINOPHEN 325 MG PO TABS
650.0000 mg | ORAL_TABLET | Freq: Once | ORAL | Status: AC
  Administered 2020-04-09: 650 mg via ORAL
  Filled 2020-04-09: qty 2

## 2020-04-09 MED ORDER — IBUPROFEN 400 MG PO TABS
600.0000 mg | ORAL_TABLET | Freq: Once | ORAL | Status: AC
  Administered 2020-04-09: 600 mg via ORAL
  Filled 2020-04-09: qty 1

## 2020-04-09 MED ORDER — HYDROCODONE-ACETAMINOPHEN 5-325 MG PO TABS: 1.0000 | ORAL_TABLET | Freq: Every evening | ORAL | 0 refills | Status: DC | PRN

## 2020-04-09 NOTE — Discharge Instructions (Signed)
Your x-rays didn't show any broken bones.  I suspect you have a large contusion/bruise causing your symptoms. For the first 48 to 72 hours please use ice 20 minutes on 20 minutes off.  After that you may switch to heat.  Please take Ibuprofen (Advil, motrin) and Tylenol (acetaminophen) to relieve your pain.    You may take up to 600 MG (3 pills) of normal strength ibuprofen every 8 hours as needed.   You make take tylenol, up to 1,000 mg (two extra strength pills) every 8 hours as needed.   It is safe to take ibuprofen and tylenol at the same time as they work differently.   Do not take more than 3,000 mg tylenol in a 24 hour period (not more than one dose every 8 hours.  Please check all medication labels as many medications such as pain and cold medications may contain tylenol.  Do not drink alcohol while taking these medications.  Do not take other NSAID'S while taking ibuprofen (such as aleve or naproxen).  Please take ibuprofen with food to decrease stomach upset.  You are being prescribed a medication which may make you sleepy. For 24 hours after one dose please do not drive, operate heavy machinery, care for a small child with out another adult present, or perform any activities that may cause harm to you or someone else if you were to fall asleep or be impaired.

## 2020-04-09 NOTE — ED Triage Notes (Signed)
Patient stated approx 1030 this morning she fell about 3-4 steps down. - head injury, - LOC, - thinners, c/o bruising and pain in buttocks area

## 2020-04-09 NOTE — ED Notes (Signed)
Pt verbalized understanding of d/c instructions, follow up and medications. Pt to WR via WC, NAD.
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
## 2020-04-10 ENCOUNTER — Telehealth: Payer: Self-pay

## 2020-04-10 NOTE — Telephone Encounter (Signed)
Transition Care Management Unsuccessful Follow-up Telephone Call  Date of discharge and from where:  04/09/2020 from [facility]  Attempts:  1st Attempt  Reason for unsuccessful TCM follow-up call:  Left voice message

## 2020-04-12 NOTE — Telephone Encounter (Signed)
Transition Care Management Follow-up Telephone Call  Date of discharge and from where: 04/09/2020 [facility]  How have you been since you were released from the hospital? Slowly getting better.   Any questions or concerns? No  Items Reviewed:  Did the pt receive and understand the discharge instructions provided? Yes   Medications obtained and verified? Yes   Other? No   Any new allergies since your discharge? No   Dietary orders reviewed? Yes  Do you have support at home? Yes    Functional Questionnaire: (I = Independent and D = Dependent) ADLs: I  Bathing/Dressing- I  Meal Prep- I  Eating- I  Maintaining continence- I  Transferring/Ambulation- I  Managing Meds- I  Follow up appointments reviewed:   PCP Hospital f/u appt confirmed? No  Patient stated she did not need a PCP appointment at this time.  Specialist Hospital f/u appt confirmed? No    Are transportation arrangements needed? No   If their condition worsens, is the pt aware to call PCP or go to the Emergency Dept.? Yes  Was the patient provided with contact information for the PCP's office or ED? Yes  Was to pt encouraged to call back with questions or concerns? Yes

## 2020-05-11 DIAGNOSIS — H5213 Myopia, bilateral: Secondary | ICD-10-CM | POA: Diagnosis not present

## 2020-09-10 ENCOUNTER — Telehealth: Payer: Self-pay | Admitting: Family Medicine

## 2020-09-10 NOTE — Telephone Encounter (Signed)
Error

## 2021-02-27 ENCOUNTER — Telehealth: Payer: Self-pay | Admitting: Family Medicine

## 2021-02-27 NOTE — Telephone Encounter (Signed)
Dr. Neita Garnet reached out to this patient last year about need for repeat colposcopy. It appears she never followed-up as recommended. I called her to help schedule a repeat PAP as she is due for a repeat PAP and if abnormal, will refer to colpo.  She stated that she will call back after the Thanksgiving holiday to schedule.  We have made multiple attempts with scheduling PAP f/u for her, we will defer appointment scheduling solely to her responsibility.

## 2021-03-26 DIAGNOSIS — N898 Other specified noninflammatory disorders of vagina: Secondary | ICD-10-CM | POA: Diagnosis not present

## 2021-03-26 DIAGNOSIS — G5601 Carpal tunnel syndrome, right upper limb: Secondary | ICD-10-CM | POA: Diagnosis not present

## 2021-04-29 DIAGNOSIS — R051 Acute cough: Secondary | ICD-10-CM | POA: Diagnosis not present

## 2021-04-29 DIAGNOSIS — Z20822 Contact with and (suspected) exposure to covid-19: Secondary | ICD-10-CM | POA: Diagnosis not present

## 2021-05-16 DIAGNOSIS — J0140 Acute pansinusitis, unspecified: Secondary | ICD-10-CM | POA: Diagnosis not present

## 2021-05-24 DIAGNOSIS — R051 Acute cough: Secondary | ICD-10-CM | POA: Diagnosis not present

## 2021-08-01 ENCOUNTER — Ambulatory Visit (INDEPENDENT_AMBULATORY_CARE_PROVIDER_SITE_OTHER): Payer: Medicaid Other | Admitting: Family Medicine

## 2021-08-01 ENCOUNTER — Encounter: Payer: Self-pay | Admitting: Family Medicine

## 2021-08-01 VITALS — BP 106/74 | HR 89 | Ht 67.0 in | Wt 210.4 lb

## 2021-08-01 DIAGNOSIS — M79671 Pain in right foot: Secondary | ICD-10-CM

## 2021-08-01 DIAGNOSIS — G5603 Carpal tunnel syndrome, bilateral upper limbs: Secondary | ICD-10-CM

## 2021-08-01 MED ORDER — MELOXICAM 15 MG PO TABS
15.0000 mg | ORAL_TABLET | Freq: Every day | ORAL | 0 refills | Status: DC
Start: 2021-08-01 — End: 2021-09-09

## 2021-08-01 NOTE — Patient Instructions (Addendum)
It was wonderful to meet you today. Thank you for allowing me to be a part of your care. Below is a short summary of what we discussed at your visit today: ? ?Carpal tunnel ?Obtain a rigid carpal tunnel splint for both wrists. Wear during the day.  ? ?Feet ?I have referred you to sports medicine for your foot pain and carpal tunnel.  Someone from their office should be calling you in 1 to 2 weeks to schedule an appointment.  If you do not hear from them, let us know. We may need to nudge along the referral.   ? ?Take meloxicam once daily.  ? ?Go for x-ray of your foot. See below: ? ?[facility] ?[address] [place], [place] [address] ?[phone] ?Mon-Fri 8-5 ? ? ?  ? ?Please bring all of your medications to every appointment! ? ?If you have any questions or concerns, please do not hesitate to contact us via phone or MyChart message.  ? ?Fayette Pho, MD  ?

## 2021-08-01 NOTE — Progress Notes (Signed)
? ? ?  SUBJECTIVE:  ? ?CHIEF COMPLAINT / HPI:  ? ?Right heel pain ?2 month duration limping, limited range of motion, focal tenderness of right lateral heel ?New job, wearing steel toe shoes ?Intermittent, comes and goes, but with new job much worse ?Has lots of arch support, padded heel, well fitting shoes but pain persists despite supports ?No global swelling ?Worsening as she slowly gains weight ?Previous history of right heel fracture and bone spur 20 years ago, no XR available in chart ? ?Carpal tunnel ?Start 2 months ago right after beginning job ?Bilateral Middle several fingers numb and tingling ?Previous history in left hand only ?New job, uses drill, vibration, repetitive movement ?Intermittent grip strength issues, limited by pain ?Previously seen at [facility] ?Wakes her up in middle of night, but she sleeps with her hands underneath her, curled up ? ?PERTINENT  PMH / PSH:  ?Patient Active Problem List  ? Diagnosis Date Noted  ? Bilateral carpal tunnel syndrome 08/06/2021  ? Intractable right heel pain 08/01/2021  ? Internal hemorrhoids without complication 07/24/2019  ? Abnormal uterine bleeding 11/12/2017  ? Mood changes 11/12/2017  ? HPV (human papilloma virus) anogenital infection 02/25/2013  ? Smoker 12/03/2012  ?  ?OBJECTIVE:  ? ?BP 106/74   Pulse 89   Ht 5\' 7"  (1.702 m)   Wt 210 lb 6.4 oz (95.4 kg)   LMP 07/12/2021 (Approximate)   SpO2 99%   BMI 32.95 kg/m?   ?PHQ-9:  ? ?  08/01/2021  ?  4:29 PM 01/26/2020  ?  9:54 AM 01/06/2020  ?  2:00 PM  ?Depression screen PHQ 2/9  ?Decreased Interest 0 0 0  ?Down, Depressed, Hopeless 0 0 1  ?PHQ - 2 Score 0 0 1  ?Altered sleeping 1 0 0  ?Tired, decreased energy 1 1 1   ?Change in appetite 1 0 0  ?Feeling bad or failure about yourself  0 0 0  ?Trouble concentrating 0 0 0  ?Moving slowly or fidgety/restless 1 0 0  ?Suicidal thoughts 0 0 0  ?PHQ-9 Score 4 1 2   ?Difficult doing work/chores Not difficult at all Not difficult at all   ?  ?Physical  Exam ?General: Awake, alert, oriented, no acute distress ?Respiratory: Unlabored respirations, speaking in full sentences, no respiratory distress ?Extremities: Bilateral grip strength and BUE 5/5 and equal, Tinnel sign present, Phalen's test positive, right ankle and foot without focal swelling or redness, right heel TTP laterally along circumference of posterior heel (parallel to floor) ? ?ASSESSMENT/PLAN:  ? ?Bilateral carpal tunnel syndrome ?Subacute, two month duration. Recommend neutral firm wrist splints and abstinence from aggravating activities as possible. She prefers to see a specialist; given no red flags for orthopedic issues, will send to sports medicine for further care. Return precautions given, see AVS for more.  ? ?Intractable right heel pain ?Subacute, 2 months duration. Initially considered plantar fasciitis and calcaneal spur, although distribution along lateral heel (approx one inch cranial proximal to plantar surface) is somewhat odd. Will send for heel XR and refer to sports medicine for custom orthotics and possible plantar fasciitis treatment as needed. Return precautions given. Will trial meloxicam after BMP for Cr returns.  ?  ? ? ?4/9, MD ?[facility]  ?

## 2021-08-05 ENCOUNTER — Ambulatory Visit
Admission: RE | Admit: 2021-08-05 | Discharge: 2021-08-05 | Disposition: A | Discharge status: Home / Self Care | Payer: Medicaid Other | Source: Ambulatory Visit | Attending: Family Medicine | Admitting: Family Medicine

## 2021-08-05 DIAGNOSIS — M79671 Pain in right foot: Secondary | ICD-10-CM

## 2021-08-06 ENCOUNTER — Telehealth: Payer: Self-pay | Admitting: Family Medicine

## 2021-08-06 ENCOUNTER — Encounter: Payer: Self-pay | Admitting: Family Medicine

## 2021-08-06 DIAGNOSIS — G5603 Carpal tunnel syndrome, bilateral upper limbs: Secondary | ICD-10-CM | POA: Insufficient documentation

## 2021-08-06 NOTE — Assessment & Plan Note (Signed)
Subacute, 2 months duration. Initially considered plantar fasciitis and calcaneal spur, although distribution along lateral heel (approx one inch cranial proximal to plantar surface) is somewhat odd. Will send for heel XR and refer to sports medicine for custom orthotics and possible plantar fasciitis treatment as needed. Return precautions given. Will trial meloxicam after BMP for Cr returns.  ?

## 2021-08-06 NOTE — Assessment & Plan Note (Signed)
Subacute, two month duration. Recommend neutral firm wrist splints and abstinence from aggravating activities as possible. She prefers to see a specialist; given no red flags for orthopedic issues, will send to sports medicine for further care. Return precautions given, see AVS for more.  ?

## 2021-08-06 NOTE — Telephone Encounter (Signed)
Called patient to discuss XR findings. No answer, left HIPPA safe VM. Patient already referred to sports medicine.  ? ?Fayette Pho, MD ? ?

## 2021-08-14 ENCOUNTER — Ambulatory Visit: Payer: Medicaid Other | Admitting: Family Medicine

## 2021-08-14 VITALS — BP 118/84 | Ht 67.0 in | Wt 203.0 lb

## 2021-08-14 DIAGNOSIS — G5603 Carpal tunnel syndrome, bilateral upper limbs: Secondary | ICD-10-CM

## 2021-08-14 DIAGNOSIS — M722 Plantar fascial fibromatosis: Secondary | ICD-10-CM | POA: Diagnosis not present

## 2021-08-14 NOTE — Patient Instructions (Signed)
You have plantar fasciitis ?Take tylenol and/or meloxicam as needed for pain. ?Plantar fascia stretch for 20-30 seconds (do 3 of these) in morning ?Lowering/raise on a step exercises 3 x 10 once or twice a day - this is very important for long term recovery. ?Ice heel for 15 minutes as needed. ?Avoid flat shoes/barefoot walking as much as possible. ?Arch straps have been shown to help with pain. ?Inserts are important (superfeet, spencos, our green insoles, custom orthotics). ?Consider strassburg sock, shockwave therapy. ?Physical therapy is also an option. ?Follow up with me in 1 month. ? ?You have carpal tunnel syndrome. ?Wear the wrist brace at nighttime and as often as possible during the day ?Continue the meloxicam for now. ?A prednisone dose pack is a consideration. ?Corticosteroid injection is a consideration to help with pain and inflammation. ?If not improving, will consider nerve conduction studies to assess severity. ? ? ?

## 2021-08-15 ENCOUNTER — Encounter: Payer: Self-pay | Admitting: Family Medicine
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
## 2021-09-07 ENCOUNTER — Other Ambulatory Visit: Payer: Self-pay | Admitting: Family Medicine

## 2021-09-07 DIAGNOSIS — M79671 Pain in right foot: Secondary | ICD-10-CM

## 2021-09-10 ENCOUNTER — Encounter: Payer: Self-pay | Admitting: *Deleted

## 2021-09-16 ENCOUNTER — Ambulatory Visit: Payer: Medicaid Other | Admitting: Family Medicine

## 2021-09-16 VITALS — BP 136/82 | Ht 67.0 in | Wt 205.0 lb

## 2021-09-16 DIAGNOSIS — G5603 Carpal tunnel syndrome, bilateral upper limbs: Secondary | ICD-10-CM | POA: Diagnosis not present

## 2021-09-16 DIAGNOSIS — M722 Plantar fascial fibromatosis: Secondary | ICD-10-CM | POA: Diagnosis not present

## 2021-09-16 MED ORDER — PREDNISONE 10 MG PO TABS
ORAL_TABLET | ORAL | 0 refills | Status: AC
Start: 2021-09-16 — End: ?

## 2021-09-16 NOTE — Patient Instructions (Signed)
Continue exercises and stretches, arch supports for your plantar fascia. Consider the strassburg sock as an addition to this. Consider shockwave therapy but it would be 4 treatments spaced a week apart (each $60).  You have carpal tunnel syndrome. Wear the wrist brace at nighttime and as often as possible during the day Stop the meloxicam while on the prednisone. Take prednisone dose pack x 6 days. If not improving, will consider nerve conduction studies to assess severity - call me if not improving over next couple weeks and we can go ahead with this. If improving follow up with me in 6 weeks.

## 2021-09-16 NOTE — Progress Notes (Signed)
   Alicia Greene is a 46 y.o. female who presents to [facility] today for the following:  Follow-up on her right plantar fasciitis.  States that it is slightly improved with meloxicam and arch supports.  Still notes some discomfort though, especially in the mornings and after prolonged sitting.  Reports that the band she was provided for our support help but sometimes caused her foot to become achy so she has to remove them.  She also endorses soreness to the top of her foot on occasion with associated muscle cramps.  She does do stretches throughout the day which seem to help.  In regards to her carpal tunnel, she feels that it is significantly worse in her left wrist compared to her right.  Rates her left carpal tunnel as a 7 out of 10, bothersome every night.  She rates her right wrist as a 4 out of 10 and only is bothersome once in a while.  Notes loss of sensation of all fingers except for her pinky and her left wrist.  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  She has been using night splints on and off. She does not feel any difference with the splinting and still feels her carpal tunnel is worse at night- even with the splints on. She had NCT several years ago, perhaps 20 or more. It was discovered at that time that she had left carpal tunnel- her symptoms weren't as bothersome to her at that time.   PMH reviewed.  ROS as above. Medications reviewed.  Exam:  BP 136/82   Ht 5\' 7"  (1.702 m)   Wt 205 lb (93 kg)   BMI 32.11 kg/m  Gen: Well NAD MSK: Wrist:  Inspection: normal Palpation: No ttp carpal bones b/l  ROM: Normal AROM  +Tinels carpal tunnel, + Phalen and reverse Phalen No thenar or hypothenar atrophy b/l Normal sensation b/l, 2+ radial pulses b/l  Right Ankle/Foot: Inspection: No visible erythema or swelling. Palpation: No pain at base of 5th MT; No tenderness over cuboid; No tenderness over N  spot or navicular prominence No tenderness on posterior aspects of lateral and medial malleolus Talar dome nontender; Range of motion is full in all directions. Strength is 5/5 in all directions. Stable lateral and medial ligaments;  Mild plantar pain with calcaneal squeeze test  No significant hypermobility in testing ankle ligaments  Limited MSK u/s bilateral wrists: Left median nerve 0.11cm2, right 0.09cm2  Assessment and Plan: 1) Bilateral carpal tunnel syndrome - history classic for this.  Surprisingly median nerve volume not as enlarged as expected.  Continue wrist braces.  Oral prednisone packs.  Consider NCV/EMGs if still not improving.  F/u in 6 weeks.  2) Right plantar fasciitis - continue exercises, stretches, arch supports.  Consider strassburg sock, shockwave therapy.

## 2021-09-17 ENCOUNTER — Encounter: Payer: Self-pay | Admitting: Family Medicine

## 2021-11-27 ENCOUNTER — Ambulatory Visit: Payer: Medicaid Other | Admitting: Family Medicine

## 2021-12-04 ENCOUNTER — Ambulatory Visit: Payer: Medicaid Other | Admitting: Family Medicine

## 2021-12-18 ENCOUNTER — Ambulatory Visit: Payer: Medicaid Other | Admitting: Family Medicine

## 2021-12-18 VITALS — BP 120/75 | Ht 67.0 in | Wt 200.0 lb

## 2021-12-18 DIAGNOSIS — M722 Plantar fascial fibromatosis: Secondary | ICD-10-CM | POA: Diagnosis not present

## 2021-12-18 DIAGNOSIS — G5603 Carpal tunnel syndrome, bilateral upper limbs: Secondary | ICD-10-CM

## 2021-12-18 NOTE — Patient Instructions (Signed)
Consider custom orthotics - let melanie know if you want to do this in the future.  Cost is 190 dollars for a pair. Continue with your arch supports, band, home exercises. Meloxicam as needed. Shockwave therapy is an option in the future too. Consider nerve conduction studies if the wrists do not continue to slowly improve. Continue the wrist braces at nighttime. Follow up with me as needed if you're doing well otherwise.

## 2021-12-19 ENCOUNTER — Encounter: Payer: Self-pay | Admitting: Family Medicine
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
## 2022-01-26 DIAGNOSIS — M545 Low back pain, unspecified: Secondary | ICD-10-CM | POA: Diagnosis not present

## 2022-09-08 ENCOUNTER — Ambulatory Visit (INDEPENDENT_AMBULATORY_CARE_PROVIDER_SITE_OTHER): Payer: Medicaid Other | Admitting: Family Medicine

## 2022-09-08 ENCOUNTER — Other Ambulatory Visit (HOSPITAL_COMMUNITY)
Admission: RE | Admit: 2022-09-08 | Discharge: 2022-09-08 | Disposition: A | Discharge status: Home / Self Care | Payer: Medicaid Other | Source: Ambulatory Visit | Attending: Family Medicine | Admitting: Family Medicine

## 2022-09-08 ENCOUNTER — Encounter: Payer: Self-pay | Admitting: Family Medicine

## 2022-09-08 VITALS — BP 130/75 | HR 85 | Ht 67.0 in | Wt 202.4 lb

## 2022-09-08 DIAGNOSIS — G5603 Carpal tunnel syndrome, bilateral upper limbs: Secondary | ICD-10-CM

## 2022-09-08 DIAGNOSIS — Z01419 Encounter for gynecological examination (general) (routine) without abnormal findings: Secondary | ICD-10-CM | POA: Diagnosis present

## 2022-09-08 DIAGNOSIS — N898 Other specified noninflammatory disorders of vagina: Secondary | ICD-10-CM

## 2022-09-08 DIAGNOSIS — B9689 Other specified bacterial agents as the cause of diseases classified elsewhere: Secondary | ICD-10-CM | POA: Diagnosis not present

## 2022-09-08 DIAGNOSIS — M79671 Pain in right foot: Secondary | ICD-10-CM

## 2022-09-08 DIAGNOSIS — Z124 Encounter for screening for malignant neoplasm of cervix: Secondary | ICD-10-CM | POA: Diagnosis present

## 2022-09-08 DIAGNOSIS — F17200 Nicotine dependence, unspecified, uncomplicated: Secondary | ICD-10-CM | POA: Diagnosis not present

## 2022-09-08 DIAGNOSIS — R4586 Emotional lability: Secondary | ICD-10-CM

## 2022-09-08 DIAGNOSIS — N939 Abnormal uterine and vaginal bleeding, unspecified: Secondary | ICD-10-CM

## 2022-09-08 LAB — POCT WET PREP (WET MOUNT)
Clue Cells Wet Prep Whiff POC: POSITIVE
Trichomonas Wet Prep HPF POC: ABSENT

## 2022-09-08 MED ORDER — METRONIDAZOLE 500 MG PO TABS
500.0000 mg | ORAL_TABLET | Freq: Two times a day (BID) | ORAL | 0 refills | Status: AC
Start: 2022-09-08 — End: 2022-09-15

## 2022-09-08 MED ORDER — MELOXICAM 15 MG PO TABS: ORAL_TABLET | ORAL | 0 refills | Status: DC

## 2022-09-08 MED ORDER — BUPROPION HCL ER (XL) 150 MG PO TB24: 150.0000 mg | ORAL_TABLET | Freq: Every day | ORAL | 0 refills | Status: DC
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
## 2022-09-08 NOTE — Patient Instructions (Addendum)
Tobacco Patient Instructions  I am sending a prescription for Wellbutrin.  Whenever you are ready, you want to plan to take this with the intention of stopping smoking within a week or 2 of starting the medications.  He will start with 1 tablet for 1 week and then increase to 2 tablets.  If you start to have more side effects from the 2 tablets and you can back down to 1.  After you start the medication, please call to make an appointment for 2-week follow-up.  Quitting smoking is one of the most important decisions you can make for your current and future health. Consider what you dislike about smoking and how quitting could personally benefit you. Try to cut down.   My target quit date is:   Starting today, Be a Quitter!  Remind yourself why you want to quit.  Delay your first cigarette of the day for as long as possible.  Start cleaning out all pockets, drawers, and your car of cigarettes.  Getting Through the Cravings Once You Are Smoke Free: Each craving will last about 10 minutes, whether or not you smoke. Here's how to get through the cravings without cigarettes:  DELAY: Tell yourself that you'll wait for the next craving. Do it every time! DEEP BREATHS: One reason smoking feels good is because you breathe in deeply to inhale. Take four slow, deep breaths and feel the relaxation without the hamful effects of cigarettes. DRINK WATER: Drink a glass of cool water. It will give your hands and mouth something to do and will help flush the nicotine out of your system faster. DIVERT: Do something else -- brush your teeth, take a walk, call a friend who can offer you support. Just moving onto something other than thinking about cigarettes will move you through the craving.   Frequently Asked Questions  What can I do when I get the urge to smoke? To get through the urge to smoke, try the following:  Review your reasons for quitting and think of all the benefits to your health, your finances,  and your family.  Remind yourself that there is no such thing as just one cigarette -- or even one puff.  Ride out the desire to smoke. Use the 4 Os -- Delay, Deep Breaths, Drink Water and Divert to get you through. The craving will go away eventually. Do not fool yourself into thinking you can have just one cigarette.  Any tips on how to deal with stress? Stress is a natural part of life. The key is to deal with it without reaching for a cigarette. Taking deep breaths, counting backwards from 10 and asking yourself 1-how big a deal is this?"  Writing down your feelings, talking with a friend and doing things like positive self-talk and meditation are some other ways that people deal with daily stress.  What if I start smoking again? Slips happen. Most people try to quit smoking a few times before they are successful. Don't beat yourself up if this happens to you! Ask yourself if this was a slip or a relapse. A slip is a one-time mistake that is quickly corrected. A relapse is going back to your old smoking habits.   If you slip, don't give up. Think of it as a learning experience. Ask yourself what went wrong and renew your commitment to staying away from smoking for good.  If you relapse, try not to get discouraged. Ask yourself the question "What caused me to start smoking?"  Figure out what helped you and what didn't when you tried to quit. Knowing why you relapsed is useful information for your next attempt to quit.

## 2022-09-09 NOTE — Assessment & Plan Note (Signed)
Smokes 1PPD currently and desires to quit but has a lot of stressors that affect her ability to commit to quitting. Has quit previously using Wellbutrin and would like to attempt this again. Completed smoking cessation counseling with plan to pick a quit date and start the medication 2 weeks prior to that date.  - Wellbutrin XL 150mg  daily x7 days, then increase to 300mg  if tolerated - Follow-up 2 weeks after medication initiation - May use Dr. Raymondo Band for continuity if appropriate

## 2022-09-09 NOTE — Assessment & Plan Note (Signed)
Affects patient's ability to function sometimes. She is currently undergoing treatment with sports medicine and is aware that surgical intervention may be required in the future but desires to hold off on this.

## 2022-09-09 NOTE — Assessment & Plan Note (Signed)
PHQ 9 is on the lower side at 6, but patient seems to have significant anxiety related to being responsible for her entire family as a single mother of 4 with an ailing mother. Doesn't have any SI/HI or plans for harm but is needing help. Right now she would like to focus on getting her child through his eye surgery and also work towards quitting smoking. Feel that counseling would be a good step in the right direction, but concerned that patient will not have the time to dedicate to counseling given her responsibilities and time constraints, but highly encourage this intervention. Will start wellbutrin for smoking cessation with close monitoring of mood.

## 2022-09-09 NOTE — Assessment & Plan Note (Signed)
At this time, given her age, it may be related to peri-menopause. Symptoms do not seem to be significantly bothering her at this time, but we will need to continue to monitor to ensure we don't need to further evaluate.

## 2022-09-12 ENCOUNTER — Telehealth: Payer: Self-pay

## 2022-09-12 NOTE — Telephone Encounter (Signed)
Patient calls nurse line regarding results of PAP.   Advised that results are still pending and we will let her know once these are back.   Patient appreciative.   [name], RN

## 2022-09-18 LAB — CYTOLOGY - PAP
Chlamydia: NEGATIVE
Comment: NEGATIVE
Comment: NEGATIVE
Comment: NORMAL
Diagnosis: NEGATIVE
Diagnosis: REACTIVE
High risk HPV: POSITIVE — AB
Neisseria Gonorrhea: NEGATIVE

## 2022-09-19 ENCOUNTER — Telehealth: Payer: Self-pay | Admitting: Family Medicine

## 2022-09-19 NOTE — Telephone Encounter (Signed)
Attempted to call patient regarding recent results of Pap smear.  Recent Pap has persistent positive high-risk HPV.  Per our protocol, the recommendations are for patient to have a colposcopy as she had a previously abnormal Pap smear last time as well.  Notified patient via result management/MyChart message for patient to call back and try to get scheduled in our colposcopy clinic.  Homer Miller, DO

## 2022-10-06 ENCOUNTER — Other Ambulatory Visit: Payer: Self-pay | Admitting: Family Medicine

## 2022-10-07 ENCOUNTER — Other Ambulatory Visit: Payer: Self-pay | Admitting: Student

## 2022-10-07 DIAGNOSIS — R4586 Emotional lability: Secondary | ICD-10-CM

## 2022-10-07 MED ORDER — BUPROPION HCL ER (XL) 300 MG PO TB24: 300.0000 mg | ORAL_TABLET | Freq: Every day | ORAL | 2 refills | Status: DC

## 2022-10-07 NOTE — Progress Notes (Signed)
Refill med 

## 2022-10-08 ENCOUNTER — Other Ambulatory Visit: Payer: Self-pay | Admitting: Family Medicine

## 2022-10-08 DIAGNOSIS — M79671 Pain in right foot: Secondary | ICD-10-CM

## 2023-01-31 DIAGNOSIS — R0981 Nasal congestion: Secondary | ICD-10-CM | POA: Diagnosis not present

## 2023-04-05 DIAGNOSIS — M25561 Pain in right knee: Secondary | ICD-10-CM | POA: Diagnosis not present

## 2023-04-14 DIAGNOSIS — M25561 Pain in right knee: Secondary | ICD-10-CM | POA: Diagnosis not present

## 2023-06-20 IMAGING — DX DG FOOT COMPLETE 3+V*R*
3 series · 3 of 3 positions shown · non-contrast
Comparison: None.

CLINICAL DATA: Chronic right heel pain.

EXAM:
RIGHT FOOT COMPLETE - 3+ VIEW

[dg foot complete right (1 of 3)]
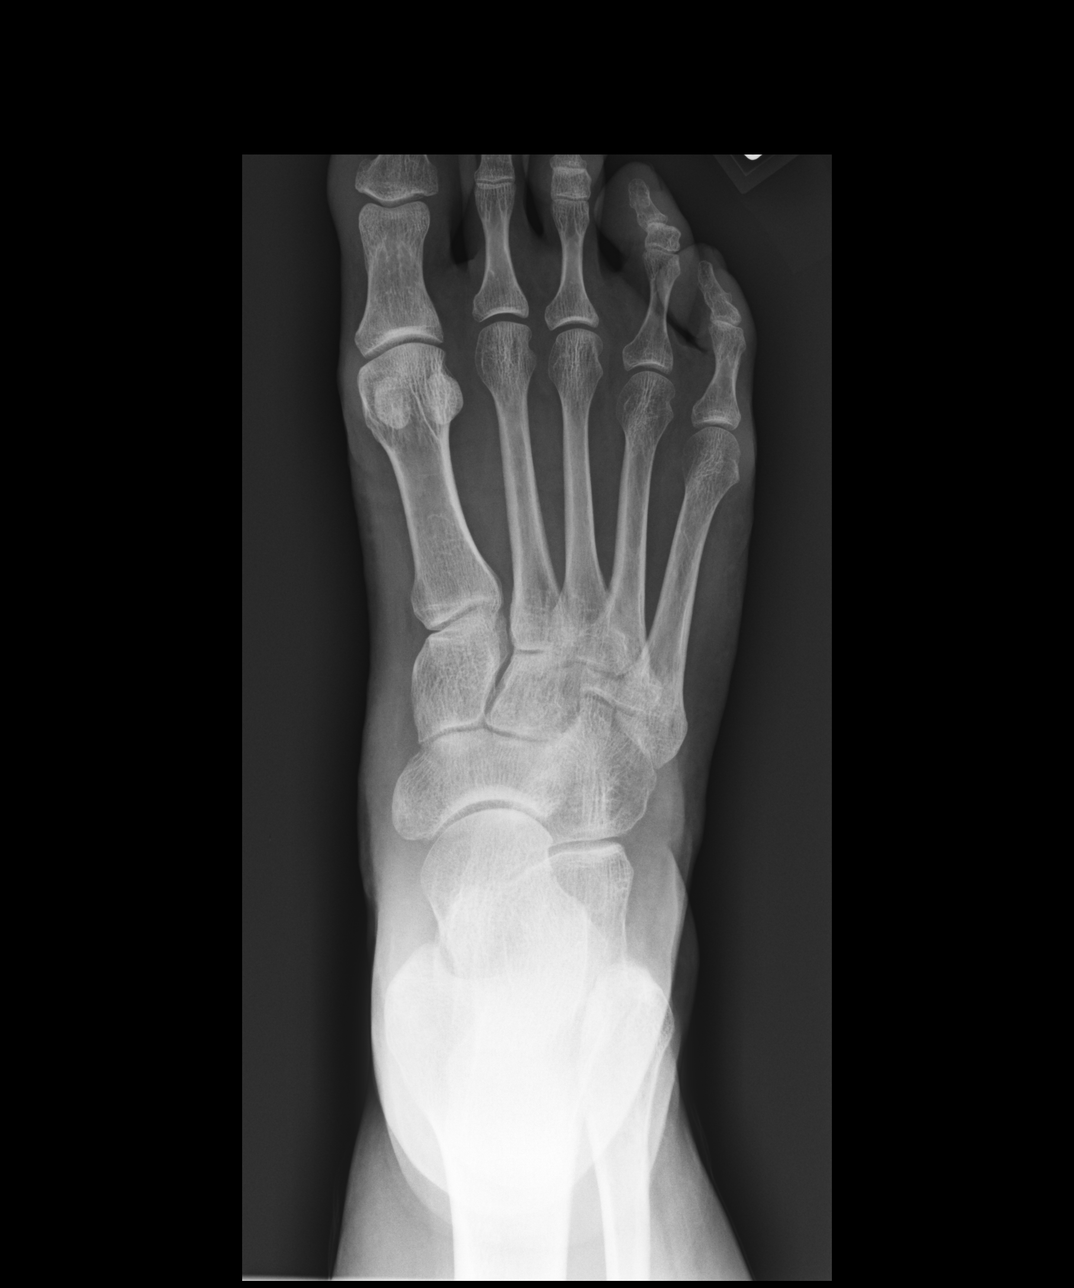

[dg foot complete right (2 of 3)]
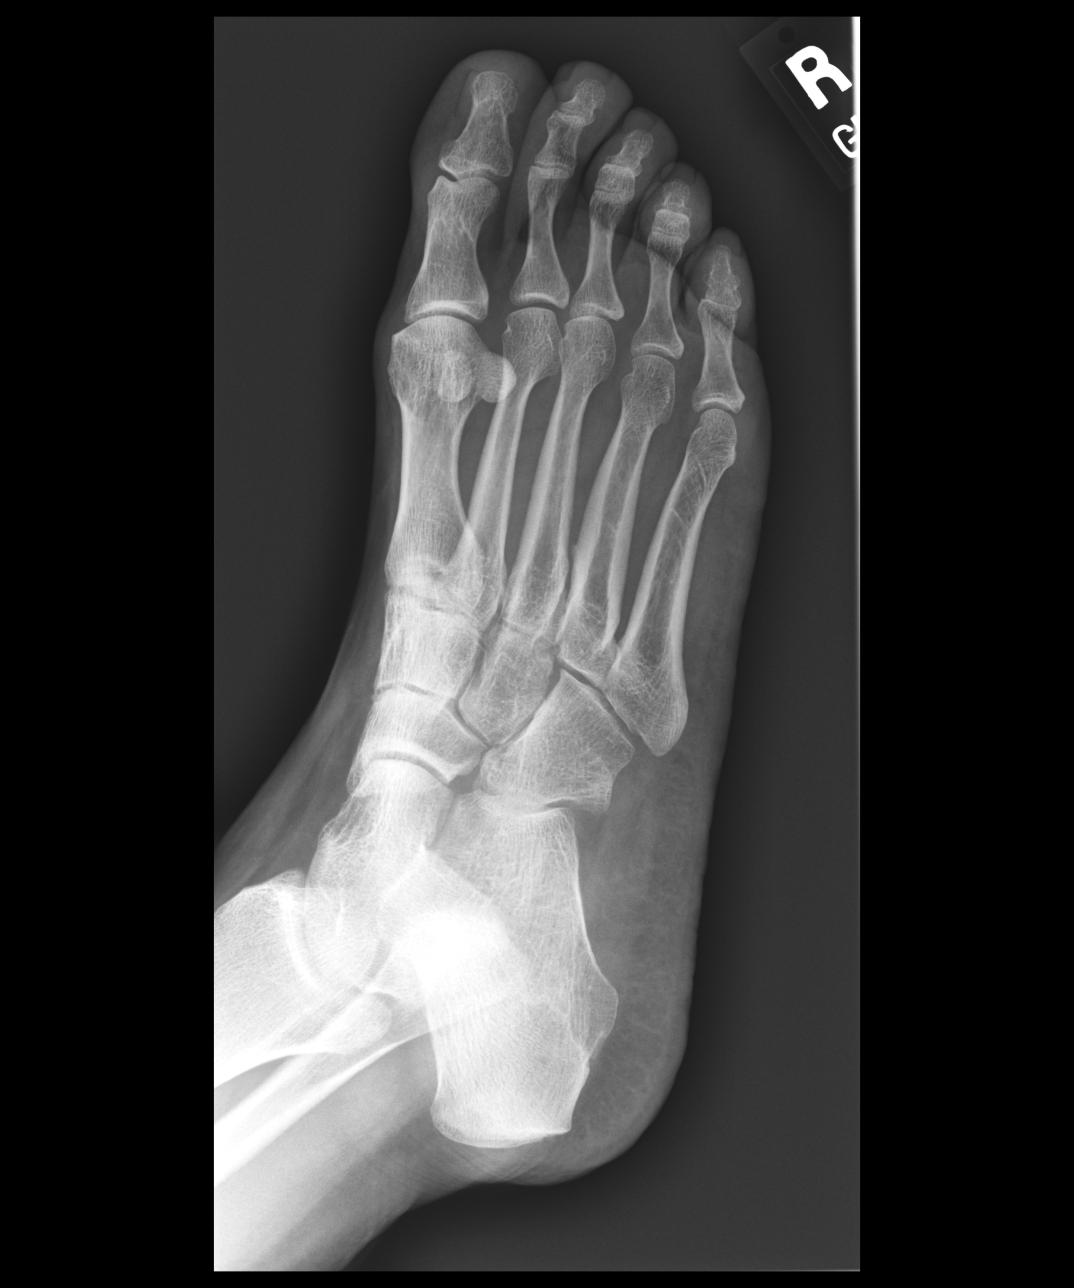

[dg foot complete right (3 of 3)]
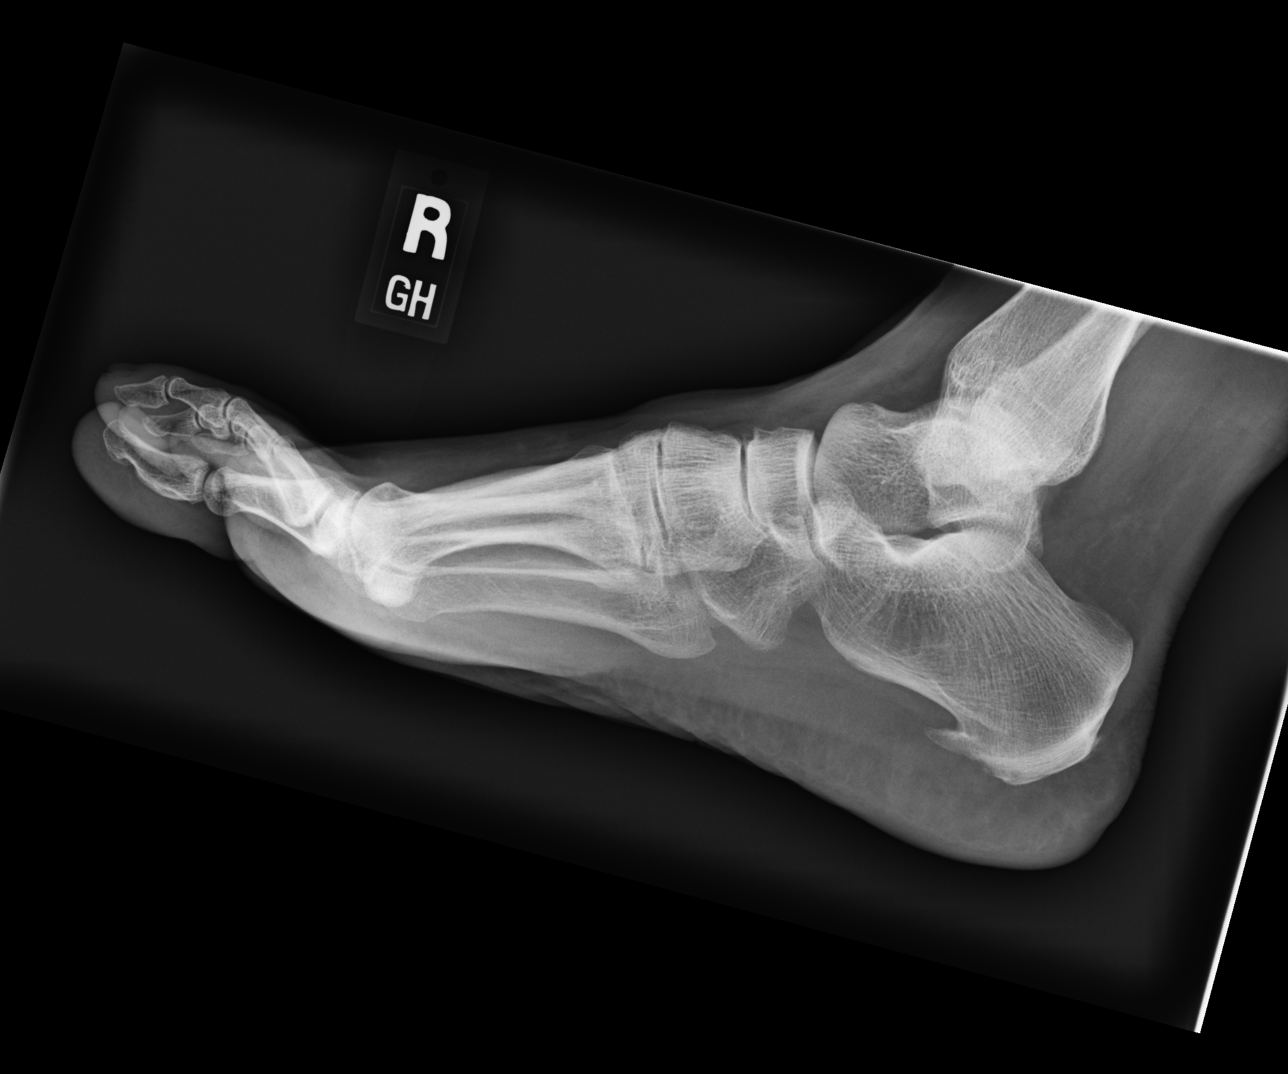

[3 of 3 positions shown; findings below may reference images not displayed]

FINDINGS: Moderate-to-large plantar calcaneal heel spur. Mild dorsal
navicular-cuneiform degenerative osteophytes. Mild joint space
narrowing and degenerative osteophytosis at the dorsal aspect of the
tarsometatarsal joints on lateral view, likely greatest at the
second and third tarsometatarsal joints.

No acute fracture or dislocation.
IMPRESSION: :
IMPRESSION: 1. Moderate-to-large plantar calcaneal heel spur.
2. Mild midfoot osteoarthritis.

## 2023-12-17 DIAGNOSIS — F411 Generalized anxiety disorder: Secondary | ICD-10-CM | POA: Diagnosis not present

## 2023-12-17 DIAGNOSIS — R61 Generalized hyperhidrosis: Secondary | ICD-10-CM | POA: Diagnosis not present

## 2024-01-14 ENCOUNTER — Encounter: Payer: Self-pay | Admitting: Student

## 2024-01-22 ENCOUNTER — Ambulatory Visit (INDEPENDENT_AMBULATORY_CARE_PROVIDER_SITE_OTHER): Admitting: Student

## 2024-01-22 VITALS — BP 152/72 | HR 72 | Ht 67.0 in | Wt 222.2 lb

## 2024-01-22 DIAGNOSIS — R030 Elevated blood-pressure reading, without diagnosis of hypertension: Secondary | ICD-10-CM | POA: Diagnosis not present

## 2024-01-22 DIAGNOSIS — R4586 Emotional lability: Secondary | ICD-10-CM | POA: Diagnosis present

## 2024-01-22 MED ORDER — BLOOD PRESSURE CUFF MISC: 1.0000 | Freq: Every day | 0 refills | Status: DC

## 2024-01-22 MED ORDER — VENLAFAXINE HCL ER 75 MG PO CP24: 75.0000 mg | ORAL_CAPSULE | Freq: Every day | ORAL | 6 refills | Status: DC

## 2024-01-22 NOTE — Assessment & Plan Note (Signed)
 Known history of anxiety.  Endorses mild improvement on low-dose Effexor or XR of 37.5.  No active SI/HI. - Increased Effexor XR to 75 mg daily - Provided patient with list of counselors on the Medicaid - Could consider adding hydroxyzine  as needed in future to maximize control - Follow-up in 2-3 weeks to reassess medication effect.

## 2024-01-22 NOTE — Patient Instructions (Addendum)
 Pleasure to see you today.  Glad to hear that your medication slightly helping with your anxiety and how you feel overall.  I have increased your Effexor to 75 mg daily.  Your night sweats I suspect is more consistent with perimenopausal signs.  Your Effexor should help with some of the symptoms that you are currently having.  You can also consider trying an black cohosh which is a supplement of being shown to improve flashes in women in menopause.  Your blood pressure today was elevated.  I have sent in a prescription for a blood pressure cuff.  Please take at least 3 times weekly and follow-up in 2 to 3 weeks let us  reassess your blood pressure.    Therapy and Counseling Resources Most providers on this list will take Medicaid. Patients with commercial insurance or Medicare should contact their insurance company to get a list of in network providers.  [facility] (takes children) Location 1: [address], [address] [place], [place] [address] Location 2: [address] [place], [place] [address] [phone]   [facility] (spanish speaking therapist available)(habla espanol)(take medicare and medicaid)  [address], [place], [place] [address], [place]  [email or web address] [phone]  [facility] Counseling [address] [place], [place] [address] [phone]  [facility]   [address] [place], [place] [address]      [phone]  [facility] (spanish available) [address]  [place], [place] [address] [phone]  [facility] (take [facility] and medicare) [address], [address]  [place], [place] [address]       [phone]     [facility] (virtual only) [phone]  [facility] [address], [place], [place] [phone]  [facility]:  [address] [place] [phone]  [facility]:  [address]  [address] [place] [phone]  [facility] (under & uninsured) [address]   [place] [phone]    [email or web address]    [facility] [address]  [place]    [phone]  [facility] -[address]     Phone:  [phone]     [facility]-  [address]  [phone]   [facility] [address]      [place], [place] [phone]  [facility]: [address] [place], [place], [place]  [phone]   [facility] (Spanish therapist) [email or web address]  [address] [place]  [address]   [place] [address]    [phone]    [facility]   [address],  [place], [place]  [phone]   [facility]  [address] [place]  [phone]  [facility]  [address] [place], [place]        [phone]  Open Access/Walk In Clinic under & uninsured  [facility]  [address] [place], [place] [phone] Crisis [phone]  [facility] [address],  (Spanish)   [address] , [place]: [phone]) 8:30 - 12; 1 - 2:30  [facility] [employer],  [address] [place], [place]    [phone]):8:30 - 12; 2 - 3PM  [facility] [employer],  [address],  [place]; [phone]):   Mon - Fri 8 AM - 5 PM  [facility] [address] [place]  MWF 12:30 to 3:00 or call to schedule an appointment  [phone]  Specific Provider options Psychology Today  [email or web address]  click on find a therapist  enter your zip code left side and select or tailor a therapist for your specific need.   [facility] Provider Directory [email or web address]  (Medicaid)   Follow all drop down to find a provider  Social Support  program [facility] [place] [phone] or Photo[employer]pl [address], [place], [place] Recovery support and educational   24- Hour Availability:   [facility]  [address] [place], [place] [phone] Crisis [phone]  [facility] [phone]  [facility]  [phone]   [facility]  [phone] (after hours)  Therapeutic Alternative/Mobile Crisis   [phone]  [place]  National Suicide Hotline  619-187-2669 MERRILYN)  Call 911 or go to emergency room  [facility]  [phone]);  [place] and [employer]  [phone]); [place], [place], [place], [place], [place], [place], [place]

## 2024-01-22 NOTE — Progress Notes (Signed)
    SUBJECTIVE:   CHIEF COMPLAINT / HPI:   48 year old female with known history of generalized anxiety presenting today for anxiety follow-up.  Patient attributes her anxiety to life stressors including taking care of 4 kids as a single mom, work and also dealing with grief following the passing of her mom earlier this year.  She recently saw a provider with [facility] where she was started on Effexor XR 37.5 mg daily.  She endorses mild improvement on the medication but still feel she could do better. No SI/HI  Night sweat Patient states she has been having night sweats for the past few weeks.  Usually will wake up being drenched in sweat.  Denies any redness, chills, chronic cough.  No recent travel and smoked for years but quit about a week ago.  Significant weight loss however has gained about 40 pounds since quitting smoking about a year ago. Period has been irregular and lighter since the start of this year.   PERTINENT  PMH / PSH: Reviewed  OBJECTIVE:   BP (!) 152/72   Pulse 72   Ht 5' 7 (1.702 m)   Wt 222 lb 3.2 oz (100.8 kg)   SpO2 100%   BMI 34.80 kg/m    Physical Exam General: Alert, well appearing, NAD Cardiovascular: RRR, No Murmurs, Normal S2/S2 Respiratory: CTAB, No wheezing or Rales Abdomen: No distension or tenderness Extremities: No edema on extremities   Psych: Pleasant, cooperative, appropriate response  ASSESSMENT/PLAN:   Mood changes Known history of anxiety.  Endorses mild improvement on low-dose Effexor or XR of 37.5.  No active SI/HI. - Increased Effexor XR to 75 mg daily - Provided patient with list of counselors on the Medicaid - Could consider adding hydroxyzine  as needed in future to maximize control - Follow-up in 2-3 weeks to reassess medication effect.   Night sweat Suspect vasomotor symptoms of perimenopausal syndrome.  Currently on SNRI.  Will see improvements given his dose of Effexor XR to 75 mg daily.  Recommend trial of black cohosh  supplements and discussed therapy which patient can consider if no improvement follow-up.  Elevated BP BP elevated  with no official HTN diagnosis.  Sent in prescription for blood pressure cuff and encouraged patient to check blood pressure daily we will reassess in 2-3 weeks.   Norleen April, MD [facility]

## 2024-02-19 ENCOUNTER — Ambulatory Visit (INDEPENDENT_AMBULATORY_CARE_PROVIDER_SITE_OTHER): Admitting: Student

## 2024-02-19 ENCOUNTER — Encounter: Payer: Self-pay | Admitting: Student

## 2024-02-19 VITALS — BP 151/91 | HR 72 | Ht 67.0 in | Wt 225.2 lb

## 2024-02-19 DIAGNOSIS — G5603 Carpal tunnel syndrome, bilateral upper limbs: Secondary | ICD-10-CM

## 2024-02-19 MED ORDER — GABAPENTIN 100 MG PO CAPS: 100.0000 mg | ORAL_CAPSULE | Freq: Every day | ORAL | 3 refills | Status: DC

## 2024-02-19 NOTE — Patient Instructions (Signed)
 Pleasure to see you today.  Your blood pressure today was still elevated.  Please make sure you get a blood pressure cuff to check your blood pressure daily and keep a record of it for 2 weeks and then follow-up with me in 2 weeks and then to see what your home blood pressure.  Also I recommend following up with our pharmacist Dr. Koval for to do a 24-hour BP monitoring.  Please make sure that appointment is scheduled before I see you in 2 weeks.

## 2024-02-19 NOTE — Progress Notes (Signed)
    SUBJECTIVE:   CHIEF COMPLAINT / HPI:   Alicia Greene is a 48 year old female who presents with concerns about elevated blood pressure.  She is unable to obtain a blood pressure cuff from [facility] and requires a handwritten prescription to acquire one from a medical supply store. She has not monitored her blood pressure at home since her last visit. She associates the onset of elevated blood pressure with starting Effexor for anxiety in August 2025, having no prior history of hypertension. She is considering using her daughter's blood pressure cuff for home monitoring.   She also seeks advice on managing arthritis and carpal tunnel pain, questioning if these could influence her blood pressure.  Endorses numbness of her hands despite wearing wrist frequently.  Consulted specialist who recommended possible surgery however patient having surgery at this time.  PERTINENT  PMH / PSH: Reviewed   OBJECTIVE:   BP (!) 151/91   Pulse 72   Ht 5' 7 (1.702 m)   Wt 225 lb 3.2 oz (102.2 kg)   LMP 12/07/2023 (Approximate)   SpO2 97%   BMI 35.27 kg/m    Physical Exam General: Alert, well appearing, NAD Cardiovascular: RRR, No Murmurs, Normal S2/S2 Respiratory: CTAB, No wheezing or Rales Abdomen: No distension or tenderness   ASSESSMENT/PLAN:   Bilateral carpal tunnel syndrome Endorses paresthesia secondary to carpal tunnel.  Patient deferring surgery by specialist.  Agreeable to starting on trial gabapentin for symptom relief.  -Rx gabapentin 100 mg nightly   Elevated blood pressure Blood pressure elevated, No hypertension history. Antihypertensive medication deferred by patient pending evaluation. - Recommend home BP monitoring for 2 weeks and follow-up with BP log - Schedule 24-hour blood pressure monitoring with pharmacist. - Follow up in two weeks to review readings and discuss medication.   Norleen April, MD [facility]

## 2024-02-19 NOTE — Assessment & Plan Note (Signed)
 Endorses paresthesia secondary to carpal tunnel.  Patient deferring surgery by specialist.  Agreeable to starting on trial gabapentin for symptom relief.  -Rx gabapentin 100 mg nightly

## 2024-02-29 ENCOUNTER — Encounter: Payer: Self-pay | Admitting: Student

## 2024-03-10 ENCOUNTER — Telehealth: Payer: Self-pay | Admitting: Pharmacist

## 2024-03-10 NOTE — Telephone Encounter (Signed)
 Patient contacted for follow-up of need to reschedule Amb Blood Pressure monitoring for alternate date/time  Patient agreed to schedule 12/11 and 12/13 both at 4:00 PM for Blood Pressure assessment.  Monitor will be turned off after 24 hours.   Possible Blood Pressure elevation with venlafaxine   Total time with patient call and documentation of interaction: 12 minutes.

## 2024-03-11 ENCOUNTER — Ambulatory Visit: Admitting: Pharmacist

## 2024-03-15 ENCOUNTER — Ambulatory Visit: Admitting: Pharmacist

## 2024-03-15 ENCOUNTER — Encounter: Payer: Self-pay | Admitting: Pharmacist

## 2024-03-15 VITALS — BP 187/110 | Wt 220.0 lb

## 2024-03-15 DIAGNOSIS — R030 Elevated blood-pressure reading, without diagnosis of hypertension: Secondary | ICD-10-CM | POA: Diagnosis not present

## 2024-03-15 DIAGNOSIS — Z87891 Personal history of nicotine dependence: Secondary | ICD-10-CM | POA: Diagnosis not present

## 2024-03-15 NOTE — Assessment & Plan Note (Signed)
 History of smoking ~ 1.5 ppd for ~ 25 years.  Reports quitting both tobacco and marijuana smoking for > 1 year.  Congratulated on success and encouraged continued abstinence.  Updated both social history and problem list changed to History of tobacco use disorder.

## 2024-03-15 NOTE — Assessment & Plan Note (Signed)
 History of elevated blood pressure over the last year, during period of grief and weight gain of ~ 50 lbs.   Does NOT have her own Blood Pressure home monitor at this time.  Recently used venlafaxine  which likely elevated blood pressure.  Goal presssure of <130/80 mm Hg.     -Placed blood pressure cuff, provided education, patient instructed to wear cuff for 24 hours and return tomorrow to review results.

## 2024-03-15 NOTE — Progress Notes (Signed)
 S:     Chief Complaint  Patient presents with   Medication Management    Amb Blood Pressure Monitor - Day #1   48 y.o. female who presents for hypertension evaluation, education, and management. Patient arrives in good spirits and presents without any assistance.    Patient was referred and last seen by Primary Care Provider, Dr. Rosendo, on 02/19/2024.  At last visit, patient had concerning elevations in blood pressure, potentially due to use of venlafaxine .    PMH is significant for Grief reaction, stress, past history of smoking both tobacco and marijuana.   Medication denies use of any scheduled medications.  Reports only PRN use of meloxicam , gabapentin .  Discussed procedure for wearing the monitor and gave patient written instructions. Monitor was placed on non-dominant arm with instructions to return in the morning.   Current BP Medications include:  None (tapered herself completely OFF venlafaxine  since last PCP visit).   Dietary habits include: Gain of ~ 50 lbs in last year due to grief reaction.  Stress with parenting 4 children.   O:  Review of Systems  Neurological:  Negative for dizziness and headaches.  Psychiatric/Behavioral:  Positive for depression. The patient is nervous/anxious and has insomnia.   All other systems reviewed and are negative.   Physical Exam  Last 3 Office BP readings: BP Readings from Last 3 Encounters:  03/15/24 (!) 187/110  02/19/24 (!) 151/91  01/22/24 (!) 152/72    Clinical Atherosclerotic Cardiovascular Disease (ASCVD):  The ASCVD Risk score (Arnett DK, et al., 2019) failed to calculate for the following reasons:   Cannot find a previous HDL lab   Cannot find a previous total cholesterol lab  Basic Metabolic Panel    Component Value Date/Time   NA 143 07/08/2016 1325   K 4.0 07/08/2016 1325   CL 102 07/08/2016 1325   CO2 21 07/08/2016 1325   GLUCOSE 108 (H) 07/08/2016 1325   GLUCOSE 88 06/25/2013 1215   BUN 12  07/08/2016 1325   CREATININE 0.85 07/08/2016 1325   CALCIUM 9.4 07/08/2016 1325   GFRNONAA 85 07/08/2016 1325   GFRAA 98 07/08/2016 1325    ABPM Study Data: Arm Placement left arm   For Office Goal BP of <130/80 mmHg:  ABPM thresholds: Overall BP <125/75 mmHg, daytime BP <130/80 mmHg, sleeptime BP <110/65 mmHg   A/P: History of elevated blood pressure over the last year, during period of grief and weight gain of ~ 50 lbs.   Does NOT have her own Blood Pressure home monitor at this time.  Recently used venlafaxine  which likely elevated blood pressure.  Goal presssure of <130/80 mm Hg.     -Placed blood pressure cuff, provided education, patient instructed to wear cuff for 24 hours and return tomorrow to review results.  Mood disorder recently discontinued venlafaxine  at the direction of PCP.  Reports excess worry which she rated as more problematic than depressed mood.  - Plan for new PCP introduction and potential initiation of evaluation and new option of therapy after discontinuation of venlafaxine .    History of smoking ~ 1.5 ppd for ~ 25 years.  Reports quitting both tobacco and marijuana smoking for > 1 year.  Congratulated on success and encouraged continued abstinence.  Updated both social history and problem list changed to History of tobacco use disorder.  - reports breathing much improved since she quit smoking.  - Now vaping Geek Bar B-burst (contains about 40mg  nicotine per device 20mg /ml)    Written  patient instructions provided including activity/symptom/event log. Patient verbalized understanding of plan. Total time in face to face counseling 24 minutes.    Follow-up: Thursday PM for review and potential management

## 2024-03-15 NOTE — Patient Instructions (Signed)
.  Blood Pressure Activity Diary Time Lying down/ Sleeping Walking/ Exercise Stressed/ Angry Headache/ Pain Dizzy  9 AM       10 AM       11 AM       12 PM       1 PM       2 PM       Time Lying down/ Sleeping Walking/ Exercise Stressed/ Angry Headache/ Pain Dizzy  3 PM       4 PM        5 PM       6 PM       7 PM       8 PM       Time Lying down/ Sleeping Walking/ Exercise Stressed/ Angry Headache/ Pain Dizzy  9 PM       10 PM       11 PM       12 AM       1 AM       2 AM       3 AM       Time Lying down/ Sleeping Walking/ Exercise Stressed/ Angry Headache/ Pain Dizzy  4 AM       5 AM       6 AM       7 AM       8 AM       9 AM       10 AM        Time you woke up: _________                  Time you went to sleep:__________  Come back Thursday 12/11 at 4:00 PM to review results  Call the [facility] if you have any questions before then [phone])  Wearing the Blood Pressure Monitor The cuff will inflate every 20 minutes during the day and every 30 minutes while you sleep. Fill out the blood pressure-activity diary during the day, especially during activities that may affect your reading -- such as exercise, stress, walking, taking your blood pressure medications  Important things to know: Avoid taking the monitor off for the next 24 hours, unless it causes you discomfort or pain. Do NOT get the monitor wet and do NOT try to clean the monitor with any cleaning products. Do NOT put the monitor on anyone else's arm. When the cuff inflates, avoid excess movement. Let the cuffed arm hang loosely, slightly away from the body. Avoid flexing the muscles or moving the hand/fingers. Remember to fill out the blood pressure activity diary. If you experience severe pain or unusual pain (not associated with getting your blood pressure checked), remove the monitor.  Troubleshooting:  Code  Troubleshooting   1  Check cuff position, tighten cuff   2, 3  Remain still  during reading   4, 87  Check air hose connections and make sure cuff is tight   85, 89  Check hose connections and make tubing is not crimped   86  Push START/STOP to restart reading   88, 91  Retry by pushing START/STOP   90  Replace batteries. If problem persists, remove monitor and bring back to   clinic at follow up   97, 98, 99  Service required - Remove monitor and bring back to clinic at follow up

## 2024-03-17 ENCOUNTER — Encounter: Payer: Self-pay | Admitting: Pharmacist

## 2024-03-17 ENCOUNTER — Ambulatory Visit: Admitting: Pharmacist

## 2024-03-17 VITALS — BP 174/110 | HR 94

## 2024-03-17 DIAGNOSIS — I10 Essential (primary) hypertension: Secondary | ICD-10-CM

## 2024-03-17 MED ORDER — OLMESARTAN MEDOXOMIL-HCTZ 20-12.5 MG PO TABS: 1.0000 | ORAL_TABLET | Freq: Every day | ORAL | 1 refills | Status: AC

## 2024-03-17 NOTE — Patient Instructions (Signed)
 It was nice to see you today!  Thank you for completing the blood pressure monitoring evaluation.  Your goal blood pressure is < 130/80 mmHg   Medication Changes: START Olmesartan/hydrochlorothiazide - 20/12.5 once daily  Continue all other medication the same.   Monitor blood pressure at home and keep a log (on a piece of paper) to bring with you to your next visit.

## 2024-03-18 NOTE — Assessment & Plan Note (Signed)
 History of hypertension/elevated blood pressure readings at multiple recent visits. Found to have persistently elevated and uncontrolled blood pressure with 24-hour ambulatory blood pressure evaluation which demonstrates an average AWAKE blood pressure of 174/110 mmHg. Nocturnal dipping pattern is normal.   Changes to medications - Started Low Dose combination olmesartan/hydrochlorothiazide 20/12.5mg  once daily. Results reviewed and written information provided.  - BMET at follow-up 1 week.

## 2024-03-18 NOTE — Progress Notes (Addendum)
° °  S:     Chief Complaint  Patient presents with   Medication Management    Amb Blood Pressure Monitor - Day #2   48 y.o. female who presents for hypertension evaluation, education, and management.  Patient arrives in fair-good spirits and presents without any assistance.  Patient returns to clinic with 24 hour blood pressure monitor and reports the cuff was tight throughout the day, reports it was stressing me out and admits to removing the cuff / unable to to wear the ambulatory blood pressure cuff for the entire 24 evaluation period (while asleep).     O:  Review of Systems  Eyes:  Positive for double vision and pain.  Musculoskeletal:  Positive for joint pain.  Neurological:  Positive for headaches.  Psychiatric/Behavioral:  Positive for depression. The patient is nervous/anxious.     Physical Exam Vitals reviewed.  Constitutional:      Appearance: Normal appearance.  Pulmonary:     Effort: Pulmonary effort is normal.  Musculoskeletal:     Right lower leg: No edema.     Left lower leg: No edema.  Neurological:     Mental Status: She is alert.  Psychiatric:        Thought Content: Thought content normal.   Note - Hx of bilateral Tubal Ligation 2016  Last 3 Office BP readings: BP Readings from Last 3 Encounters:  03/17/24 (!) 174/110  03/15/24 (!) 187/110  02/19/24 (!) 151/91    ABPM Study Data: Arm Placement left arm  Overall Mean 24hr BP:   170/106 mmHg HR: 91  Daytime Mean BP:  174/110 mmHg HR: 94  Nighttime Mean BP:  141/76 mmHg  HR: 73 (limited number of readings)  Dipping Pattern: Yes.    Sys:   19%   Dia: 31%  [normal dipping ~10-20%]   For Office Goal BP of <130/80 mmHg:  ABPM thresholds: Overall BP <125/75 mmHg, daytime BP <130/80 mmHg, sleeptime BP <110/65 mmHg   Patient is participating in a Managed Medicaid Plan:  Yes   A/P: History of hypertension/elevated blood pressure readings at multiple recent visits. Found to have persistently elevated  and uncontrolled blood pressure with 24-hour ambulatory blood pressure evaluation which demonstrates an average AWAKE blood pressure of 174/110 mmHg. Nocturnal dipping pattern is normal.   Changes to medications - Started Low Dose combination olmesartan/hydrochlorothiazide 20/12.5mg  once daily. Results reviewed and written information provided.  - BMET at follow-up 1 week.   Written patient instructions provided. Patient verbalized understanding of treatment plan.  Total time in face to face counseling 32 minutes.    Follow-up:  Pharmacist PRN  PCP clinic visit in 1 week - visit scheduled with Dr. Lafe

## 2024-03-21 NOTE — Progress Notes (Signed)
 Reviewed and agree with Dr Rennis plan.

## 2024-03-24 ENCOUNTER — Ambulatory Visit: Payer: Self-pay | Admitting: Family Medicine

## 2024-03-24 ENCOUNTER — Encounter: Payer: Self-pay | Admitting: Family Medicine

## 2024-03-24 VITALS — BP 159/97 | HR 87 | Ht 67.0 in | Wt 229.4 lb

## 2024-03-24 DIAGNOSIS — N951 Menopausal and female climacteric states: Secondary | ICD-10-CM | POA: Diagnosis not present

## 2024-03-24 DIAGNOSIS — H04123 Dry eye syndrome of bilateral lacrimal glands: Secondary | ICD-10-CM | POA: Diagnosis not present

## 2024-03-24 DIAGNOSIS — I10 Essential (primary) hypertension: Secondary | ICD-10-CM | POA: Diagnosis present

## 2024-03-24 DIAGNOSIS — F419 Anxiety disorder, unspecified: Secondary | ICD-10-CM | POA: Diagnosis not present

## 2024-03-24 MED ORDER — ESCITALOPRAM OXALATE 5 MG PO TABS: 5.0000 mg | ORAL_TABLET | Freq: Every day | ORAL | 11 refills | Status: AC

## 2024-03-24 NOTE — Patient Instructions (Signed)
 High blood pressure - start taking your blood pressure medicine  Menopausal symptoms - start taking Lexapro  5 mg daily. This can help with anxiety as well as menopause symptoms. - there is some limited data for black kohosh supplements, you may want to consider trying this.  Annual physical in 3 weeks for blood work.

## 2024-03-24 NOTE — Progress Notes (Signed)
° ° °  SUBJECTIVE:   CHIEF COMPLAINT / HPI:   BP f/u Seen in this clinic 10/17 by Dr. Rosendo and had elevated pressures. At that time instructed to check BP daily and record readings. Since then she has been prescribed olmesartan -hydrochlorothiazide 20-12.5 mg daily but has not started taking this yet. Has been doing magnesium  and beet juice; she desires natural management of medical conditions where possible.  ? Perimenopause She has been having night sweats, mood instability. Previously on venlafaxine  but d/c'd due to rising blood pressures. She is having trouble sleeping, some anxiety.  PERTINENT  PMH / PSH: Reviewed.  OBJECTIVE:   BP (!) 159/97   Pulse 87   Ht 5' 7 (1.702 m)   Wt 229 lb 6.4 oz (104.1 kg)   SpO2 99%   BMI 35.93 kg/m   General: well-appearing, no acute distress. HEENT: normocephalic, PERRLA, EOM grossly intact, MMM Cardio: Regular rate, regular rhythm, no murmurs on exam. Pulm: No increased work of breathing. Extremities: Moves all extremities equally. Neuro: Alert and oriented x3, speech normal in content, no facial asymmetry Psych:  Cognition and judgment appear intact. Alert, communicative, and cooperative   ASSESSMENT/PLAN:   Assessment & Plan Hypertension, unspecified type Uncontrolled. - she will start taking her olmesartan -hydrochlorothiazide 20-12.5 mg daily - monitor pressures at home with home BP cuff and bring list to next appt - follow up in 3 weeks for annual physical Anxiety Reasonable that she stopped venlafaxine  given HTN; would benefit from other treatment. - start lexapro  5 mg daily; this should hopefully also help with vasomotor symptoms Perimenopausal vasomotor symptoms Mild, but bothersome enough to warrant treatment. - lexapro  as above - may consider estradiol if this is ineffective; patient no longer smokes. Bilateral dry eyes Follows with eye doctor, recent eye exam without concerns. Eyedrops rx'd per pt request.      Lauraine Norse, DO [facility]

## 2024-03-24 NOTE — Assessment & Plan Note (Signed)
 Uncontrolled. - she will start taking her olmesartan -hydrochlorothiazide 20-12.5 mg daily - monitor pressures at home with home BP cuff and bring list to next appt - follow up in 3 weeks for annual physical

## 2024-04-11 ENCOUNTER — Ambulatory Visit (INDEPENDENT_AMBULATORY_CARE_PROVIDER_SITE_OTHER): Payer: Self-pay

## 2024-04-11 VITALS — BP 129/72 | HR 96 | Ht 67.0 in | Wt 228.6 lb

## 2024-04-11 DIAGNOSIS — Z0000 Encounter for general adult medical examination without abnormal findings: Secondary | ICD-10-CM

## 2024-04-11 DIAGNOSIS — Z1211 Encounter for screening for malignant neoplasm of colon: Secondary | ICD-10-CM

## 2024-04-11 DIAGNOSIS — Z1231 Encounter for screening mammogram for malignant neoplasm of breast: Secondary | ICD-10-CM | POA: Diagnosis not present

## 2024-04-11 DIAGNOSIS — Z6835 Body mass index (BMI) 35.0-35.9, adult: Secondary | ICD-10-CM

## 2024-04-11 DIAGNOSIS — R6889 Other general symptoms and signs: Secondary | ICD-10-CM | POA: Diagnosis not present

## 2024-04-11 DIAGNOSIS — E66812 Obesity, class 2: Secondary | ICD-10-CM

## 2024-04-11 DIAGNOSIS — I10 Essential (primary) hypertension: Secondary | ICD-10-CM | POA: Diagnosis not present

## 2024-04-11 DIAGNOSIS — Z113 Encounter for screening for infections with a predominantly sexual mode of transmission: Secondary | ICD-10-CM | POA: Diagnosis not present

## 2024-04-11 MED ORDER — BLOOD PRESSURE CUFF MISC: 0 refills | Status: AC

## 2024-04-11 NOTE — Progress Notes (Signed)
" ° ° °  SUBJECTIVE:   Chief compliant/HPI: annual examination  Alicia Greene is a 49 y.o. who presents today for an annual exam.   History tabs reviewed and updated.   Patient with concerns about recent weight gain.  Death of her mother over a year ago that she is still struggling with.  Heavy lifting at work, no aerobic exercise.  Making dietary changes.  Takes many supplements.  Has been adherent to blood pressure medications since last visit. Please see remainder of relevant details per A&P  OBJECTIVE:   BP 129/72   Pulse 96   Ht 5' 7 (1.702 m)   Wt 228 lb 9.6 oz (103.7 kg)   LMP 04/01/2024   SpO2 97%   BMI 35.80 kg/m    General: Well-appearing female, intermittently tearful during visit HEENT: Pupils equal round reactive to light, no thyromegaly or lymphadenopathy CV: Regular rate and rhythm, no murmurs, 2+ radial pulses Lungs: clear to auscultation bilaterally, no increased work of breathing Abdomen: Soft, nontender, obese, bowel sounds present Extremities: No peripheral edema  ASSESSMENT/PLAN:   Assessment & Plan Routine adult health maintenance Routine screening for STI (sexually transmitted infection) Screening A1c, lipid panel today.  Screening for HIV and RPR. Discussed ASCCP recommendation for colposcopy based on 2021 and 2024 Pap smears.  Patient does not want to proceed with colposcopy, specifically biopsies, at this time and requests repeating Pap smear.  I will plan to perform Pap smear at follow-up in 1 month and make further recommendations based on those results. Will collect gc/chlam at that time. Encounter for screening mammogram for malignant neoplasm of breast Mammogram ordered today. Screening for colon cancer Discussed colon cancer gold standard screening is colonoscopy.  After risk-benefit discussion, patient requests Cologuard today.  Order placed. Forgetfulness Patient notes mixing up several of her children's Christmas presents and driving the  wrong way to work though it has been a job she is worked at for many years. CBC, TSH, vitamin B12, HIV, RPR ordered today given memory concerns. Discussed that brain fog from her perimenopause may be contributing in addition to anxiety.  Recommended she take the Lexapro  5 mg daily previously prescribed as we evaluate other etiologies. Offered counseling, but patient declined. Hypertension, unspecified type Blood pressure at goal today less than 130/80. BMP checked today. Continue olmesartan -HCTZ daily. Printed order for blood pressure cuff. Class 2 obesity with body mass index (BMI) of 35.0 to 35.9 in adult, unspecified obesity type, unspecified whether serious comorbidity present Patient is undergoing dietary modifications.  Encouraged vegetables and other healthy choices.  She lifts a lot at work, but does not have a lot of aerobic exercise.  Recommended starting with 15 to 30-minute walks several times a week and working up from there.  She is considering getting a membership at the [facility] to swim  Patient will follow-up in 1 month with Pap smear and lab follow-up at that time.  After visit note: POC A1c was not collected by the lab this visit. Will plan to have lab collect at next visit.   Alicia Oyer Alena Morrison, MD [facility]  "

## 2024-04-11 NOTE — Patient Instructions (Addendum)
 For routine screenings today I have ordered the following labs: A1c to check diabetes, lipid panel to check your cholesterol, basic metabolic panel to look at your electrolytes and kidney function Screening for sexually transmitted infections and your blood Because of your brain fog, I am checking for sexually transmitted infections, your blood counts, thyroid  function, vitamin B12 level.  I would also recommend taking the Lexapro  daily to see if this helps with your symptoms. If any of these lab tests require interventions, I will send you a MyChart message with my recommendations; otherwise, we will discuss the lab work at your follow-up in 1 month. I have ordered a screening mammogram for breast cancer screening. You will get a Cologuard test kit in the mail to collect a stool sample and screen for colon cancer. At your follow-up, we will do a Pap smear.  Consider the colposcopy as well.  I look forward to seeing you again in a month. Dr. Alena

## 2024-04-11 NOTE — Assessment & Plan Note (Signed)
 Blood pressure at goal today less than 130/80. BMP checked today. Continue olmesartan -HCTZ daily. Printed order for blood pressure cuff.

## 2024-04-12 LAB — BASIC METABOLIC PANEL WITH GFR
BUN/Creatinine Ratio: 16 (ref 9–23)
BUN: 13 mg/dL (ref 6–24)
CO2: 25 mmol/L (ref 20–29)
Calcium: 10 mg/dL (ref 8.7–10.2)
Chloride: 100 mmol/L (ref 96–106)
Creatinine, Ser: 0.79 mg/dL (ref 0.57–1.00)
Glucose: 104 mg/dL — ABNORMAL HIGH (ref 70–99)
Potassium: 3.9 mmol/L (ref 3.5–5.2)
Sodium: 140 mmol/L (ref 134–144)
eGFR: 92 mL/min/1.73

## 2024-04-12 LAB — SYPHILIS: RPR W/REFLEX TO RPR TITER AND TREPONEMAL ANTIBODIES, TRADITIONAL SCREENING AND DIAGNOSIS ALGORITHM: RPR Ser Ql: NONREACTIVE

## 2024-04-12 LAB — CBC
Hematocrit: 45.1 % (ref 34.0–46.6)
Hemoglobin: 14.8 g/dL (ref 11.1–15.9)
MCH: 29 pg (ref 26.6–33.0)
MCHC: 32.8 g/dL (ref 31.5–35.7)
MCV: 88 fL (ref 79–97)
Platelets: 375 x10E3/uL (ref 150–450)
RBC: 5.1 x10E6/uL (ref 3.77–5.28)
RDW: 12.5 % (ref 11.7–15.4)
WBC: 12.5 x10E3/uL — ABNORMAL HIGH (ref 3.4–10.8)

## 2024-04-12 LAB — LIPID PANEL
Chol/HDL Ratio: 5 ratio — ABNORMAL HIGH (ref 0.0–4.4)
Cholesterol, Total: 251 mg/dL — ABNORMAL HIGH (ref 100–199)
HDL: 50 mg/dL
LDL Chol Calc (NIH): 176 mg/dL — ABNORMAL HIGH (ref 0–99)
Triglycerides: 138 mg/dL (ref 0–149)
VLDL Cholesterol Cal: 25 mg/dL (ref 5–40)

## 2024-04-12 LAB — HEPATITIS C ANTIBODY: Hep C Virus Ab: NONREACTIVE

## 2024-04-12 LAB — HIV ANTIBODY (ROUTINE TESTING W REFLEX): HIV Screen 4th Generation wRfx: NONREACTIVE

## 2024-04-12 LAB — TSH: TSH: 2.07 u[IU]/mL (ref 0.450–4.500)

## 2024-04-12 LAB — VITAMIN B12: Vitamin B-12: 411 pg/mL (ref 232–1245)

## 2024-04-18 ENCOUNTER — Encounter: Payer: Self-pay | Admitting: Family Medicine

## 2024-04-26 LAB — COLOGUARD: COLOGUARD: NEGATIVE

## 2024-04-27 ENCOUNTER — Ambulatory Visit: Payer: Self-pay

## 2024-05-02 ENCOUNTER — Ambulatory Visit: Payer: Self-pay

## 2024-05-05 ENCOUNTER — Ambulatory Visit

## 2024-05-05 VITALS — BP 147/89 | HR 82 | Ht 67.0 in | Wt 229.8 lb

## 2024-05-05 DIAGNOSIS — N760 Acute vaginitis: Secondary | ICD-10-CM | POA: Diagnosis not present

## 2024-05-05 DIAGNOSIS — R87619 Unspecified abnormal cytological findings in specimens from cervix uteri: Secondary | ICD-10-CM

## 2024-05-05 DIAGNOSIS — B9689 Other specified bacterial agents as the cause of diseases classified elsewhere: Secondary | ICD-10-CM | POA: Diagnosis not present

## 2024-05-05 DIAGNOSIS — E785 Hyperlipidemia, unspecified: Secondary | ICD-10-CM | POA: Diagnosis not present

## 2024-05-05 DIAGNOSIS — I10 Essential (primary) hypertension: Secondary | ICD-10-CM | POA: Diagnosis present

## 2024-05-05 LAB — POCT GLYCOSYLATED HEMOGLOBIN (HGB A1C): Hemoglobin A1C: 5.9 % — AB (ref 4.0–5.6)

## 2024-05-05 MED ORDER — ATORVASTATIN CALCIUM 20 MG PO TABS: 20.0000 mg | ORAL_TABLET | Freq: Every day | ORAL | 0 refills | Status: AC

## 2024-05-05 NOTE — Patient Instructions (Signed)
" ° °  It was great to see you!  Our plans for today:  - Starting Atorvastatin  20 mg daily  - F/u in 2-4 weeks for Pap smear   We are checking some labs today, we will release these results to your MyChart.  Take care and seek immediate care sooner if you develop any concerns.       Houston Coralee HAS PGY 1 Family Medicine Resident [facility]  [address] [place], [place] [address] Fax [phone] Phone [phone] 05/05/2024, 4:27 PM  "

## 2024-05-05 NOTE — Progress Notes (Signed)
. ° ° °  SUBJECTIVE:   CHIEF COMPLAINT / HPI:   Alicia Greene 49 y.o. female present at [facility] clinic today with the following concern   Elevated BG Pt previously seen for annually visit. She is concerned of her blood glucose and A1C. 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 She would like to defer her Pap to her next visit. However would like to get wet pap today c/o possible recurrent BVs  HTN Pt w/ hx of HTN. She has been taking olmesartan -hydrochlorothiazide daily at home. States did not take the medication today. Denies HA or  vision change   PERTINENT  PMH / PSH:  +HPV Abnormal Pap HTN    OBJECTIVE:   BP (!) 147/89   Pulse 82   Ht 5' 7 (1.702 m)   Wt 229 lb 12.8 oz (104.2 kg)   LMP 04/01/2024   SpO2 96%   BMI 35.99 kg/m   Physical Exam Constitutional:      Appearance: Normal appearance.  Cardiovascular:     Rate and Rhythm: Normal rate.     Pulses: Normal pulses.  Pulmonary:     Effort: Pulmonary effort is normal.  Abdominal:     Palpations: Abdomen is soft.  Neurological:     Mental Status: She is alert and oriented to person, place, and time.  Psychiatric:        Mood and Affect: Mood normal.        Behavior: Behavior normal.      ASSESSMENT/PLAN:   Assessment & Plan Hypertension, unspecified type Elevate BP during this visit. Did not take antihypertensive meds today per pt. - Pending microalbumin/uCr result - Will continue to monitor her BP - Consider adjusting HTN medication if BP remain elevated during f/u visit - f/u in 2-4 weeks Hyperlipidemia, unspecified hyperlipidemia type I discussed her lab results with patient. Her Lipid panel result slightly elevated.  - Pt would like to starting her HLD medication  - Starting Atorvastatin  20 mg Daily  Bacterial vaginosis Pt c/o recurrently BV however she has no symptoms at this time.  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 I explained to her w/ her Hx of +HPV and abnormal Pap in the past, she is at risk for cervical ca which I highly recommended for her to get Pap as soon as she could -  Repeat Pap at next visit    F/u in 2-4 weeks for Pap and BP check    Houston Samuels, DO PGY 1 Family Medicine resident  [facility]

## 2024-05-06 ENCOUNTER — Ambulatory Visit: Payer: Self-pay

## 2024-05-06 DIAGNOSIS — E785 Hyperlipidemia, unspecified: Secondary | ICD-10-CM | POA: Insufficient documentation

## 2024-05-06 LAB — MICROALBUMIN / CREATININE URINE RATIO
Creatinine, Urine: 314.9 mg/dL
Microalb/Creat Ratio: 5 mg/g{creat} (ref 0–29)
Microalbumin, Urine: 16.2 ug/mL

## 2024-05-06 NOTE — Assessment & Plan Note (Signed)
 Pt c/o recurrently BV however she has no symptoms at this time.  Initially ordered a Wet pap but was sent to the lab incorrectly and was not be able to process the sample by lab - Called pt and suggest if c/o STIs/BV or have symptom to come back to the clinic for new sample

## 2024-05-06 NOTE — Assessment & Plan Note (Signed)
 Elevate BP during this visit. Did not take antihypertensive meds today per pt. - Pending microalbumin/uCr result - Will continue to monitor her BP - Consider adjusting HTN medication if BP remain elevated during f/u visit - f/u in 2-4 weeks
